# Patient Record
Sex: Male | Born: 1975 | Race: Black or African American | Hispanic: No | Marital: Single | Smoking: Never smoker
Health system: Southern US, Community
[De-identification: ages and names within clinical notes are randomized; demographics above are authoritative.]

## PROBLEM LIST (undated history)

## (undated) DIAGNOSIS — D61818 Other pancytopenia: Secondary | ICD-10-CM

## (undated) DIAGNOSIS — R161 Splenomegaly, not elsewhere classified: Secondary | ICD-10-CM

## (undated) DIAGNOSIS — K766 Portal hypertension: Secondary | ICD-10-CM

## (undated) DIAGNOSIS — D696 Thrombocytopenia, unspecified: Secondary | ICD-10-CM

## (undated) DIAGNOSIS — K7460 Unspecified cirrhosis of liver: Secondary | ICD-10-CM

## (undated) HISTORY — DX: Portal hypertension: K76.6

## (undated) HISTORY — DX: Unspecified cirrhosis of liver: K74.60

---

## 2015-04-26 ENCOUNTER — Encounter (HOSPITAL_COMMUNITY): Payer: Self-pay | Admitting: Emergency Medicine

## 2015-04-26 ENCOUNTER — Emergency Department (INDEPENDENT_AMBULATORY_CARE_PROVIDER_SITE_OTHER)
Admission: EM | Admit: 2015-04-26 | Discharge: 2015-04-26 | Disposition: A | Discharge status: Home / Self Care | Payer: Medicaid - Out of State | Source: Home / Self Care | Attending: Family Medicine | Admitting: Family Medicine

## 2015-04-26 ENCOUNTER — Emergency Department (HOSPITAL_COMMUNITY): Payer: Medicaid - Out of State

## 2015-04-26 ENCOUNTER — Observation Stay (HOSPITAL_COMMUNITY)
Admission: EM | Admit: 2015-04-26 | Discharge: 2015-05-02 | Disposition: A | Discharge status: Home / Self Care | Payer: Medicaid - Out of State | Attending: Internal Medicine | Admitting: Internal Medicine

## 2015-04-26 ENCOUNTER — Encounter: Payer: Self-pay | Admitting: *Deleted

## 2015-04-26 ENCOUNTER — Encounter (HOSPITAL_COMMUNITY): Payer: Self-pay | Admitting: *Deleted

## 2015-04-26 DIAGNOSIS — Z23 Encounter for immunization: Secondary | ICD-10-CM | POA: Diagnosis not present

## 2015-04-26 DIAGNOSIS — D61818 Other pancytopenia: Secondary | ICD-10-CM | POA: Diagnosis not present

## 2015-04-26 DIAGNOSIS — K766 Portal hypertension: Secondary | ICD-10-CM | POA: Diagnosis not present

## 2015-04-26 DIAGNOSIS — B182 Chronic viral hepatitis C: Secondary | ICD-10-CM

## 2015-04-26 DIAGNOSIS — R109 Unspecified abdominal pain: Secondary | ICD-10-CM

## 2015-04-26 DIAGNOSIS — R001 Bradycardia, unspecified: Secondary | ICD-10-CM | POA: Insufficient documentation

## 2015-04-26 DIAGNOSIS — D696 Thrombocytopenia, unspecified: Secondary | ICD-10-CM | POA: Diagnosis present

## 2015-04-26 DIAGNOSIS — R1902 Left upper quadrant abdominal swelling, mass and lump: Secondary | ICD-10-CM | POA: Diagnosis not present

## 2015-04-26 DIAGNOSIS — R160 Hepatomegaly, not elsewhere classified: Secondary | ICD-10-CM | POA: Diagnosis present

## 2015-04-26 DIAGNOSIS — M25562 Pain in left knee: Secondary | ICD-10-CM | POA: Diagnosis not present

## 2015-04-26 DIAGNOSIS — K922 Gastrointestinal hemorrhage, unspecified: Secondary | ICD-10-CM | POA: Diagnosis present

## 2015-04-26 DIAGNOSIS — K7460 Unspecified cirrhosis of liver: Secondary | ICD-10-CM | POA: Insufficient documentation

## 2015-04-26 DIAGNOSIS — R1012 Left upper quadrant pain: Secondary | ICD-10-CM | POA: Diagnosis not present

## 2015-04-26 DIAGNOSIS — R531 Weakness: Secondary | ICD-10-CM | POA: Insufficient documentation

## 2015-04-26 DIAGNOSIS — M25561 Pain in right knee: Secondary | ICD-10-CM | POA: Diagnosis not present

## 2015-04-26 DIAGNOSIS — D709 Neutropenia, unspecified: Secondary | ICD-10-CM | POA: Insufficient documentation

## 2015-04-26 DIAGNOSIS — M2550 Pain in unspecified joint: Secondary | ICD-10-CM

## 2015-04-26 DIAGNOSIS — R52 Pain, unspecified: Secondary | ICD-10-CM

## 2015-04-26 DIAGNOSIS — G8929 Other chronic pain: Secondary | ICD-10-CM

## 2015-04-26 DIAGNOSIS — Z139 Encounter for screening, unspecified: Secondary | ICD-10-CM

## 2015-04-26 DIAGNOSIS — I864 Gastric varices: Secondary | ICD-10-CM | POA: Diagnosis present

## 2015-04-26 DIAGNOSIS — R161 Splenomegaly, not elsewhere classified: Secondary | ICD-10-CM | POA: Diagnosis not present

## 2015-04-26 DIAGNOSIS — B1920 Unspecified viral hepatitis C without hepatic coma: Secondary | ICD-10-CM | POA: Diagnosis present

## 2015-04-26 HISTORY — DX: Thrombocytopenia, unspecified: D69.6

## 2015-04-26 HISTORY — DX: Other pancytopenia: D61.818

## 2015-04-26 HISTORY — DX: Splenomegaly, not elsewhere classified: R16.1

## 2015-04-26 LAB — COMPREHENSIVE METABOLIC PANEL
ALBUMIN: 3.8 g/dL (ref 3.5–5.0)
ALK PHOS: 46 U/L (ref 38–126)
ALT: 24 U/L (ref 17–63)
AST: 16 U/L (ref 15–41)
Anion gap: 10 (ref 5–15)
BUN: 8 mg/dL (ref 6–20)
CALCIUM: 8.9 mg/dL (ref 8.9–10.3)
CO2: 27 mmol/L (ref 22–32)
CREATININE: 1 mg/dL (ref 0.61–1.24)
Chloride: 103 mmol/L (ref 101–111)
GFR calc Af Amer: >60 mL/min (ref >60)
GFR calc non Af Amer: >60 mL/min (ref >60)
GLUCOSE: 95 mg/dL (ref 65–99)
Potassium: 4.1 mmol/L (ref 3.5–5.1)
SODIUM: 140 mmol/L (ref 135–145)
Total Bilirubin: 2.1 mg/dL — ABNORMAL HIGH (ref 0.3–1.2)
Total Protein: 6.9 g/dL (ref 6.5–8.1)

## 2015-04-26 LAB — CBC
HCT: 39.9 % (ref 39.0–52.0)
Hemoglobin: 13.3 g/dL (ref 13.0–17.0)
MCH: 28.4 pg (ref 26.0–34.0)
MCHC: 33.3 g/dL (ref 30.0–36.0)
MCV: 85.1 fL (ref 78.0–100.0)
PLATELETS: 21 10*3/uL — AB (ref 150–400)
RBC: 4.69 MIL/uL (ref 4.22–5.81)
RDW: 15.2 % (ref 11.5–15.5)
WBC: 1.2 10*3/uL — CL (ref 4.0–10.5)

## 2015-04-26 LAB — URINALYSIS, ROUTINE W REFLEX MICROSCOPIC
BILIRUBIN URINE: NEGATIVE
Glucose, UA: NEGATIVE mg/dL
HGB URINE DIPSTICK: NEGATIVE
Ketones, ur: NEGATIVE mg/dL
Leukocytes, UA: NEGATIVE
Nitrite: NEGATIVE
PROTEIN: NEGATIVE mg/dL
SPECIFIC GRAVITY, URINE: 1.026 (ref 1.005–1.030)
pH: 6 (ref 5.0–8.0)

## 2015-04-26 LAB — PROTIME-INR
INR: 1.52 — AB (ref 0.00–1.49)
Prothrombin Time: 18.3 seconds — ABNORMAL HIGH (ref 11.6–15.2)

## 2015-04-26 LAB — POCT URINALYSIS DIP (DEVICE)
BILIRUBIN URINE: NEGATIVE
Glucose, UA: NEGATIVE mg/dL
HGB URINE DIPSTICK: NEGATIVE
Ketones, ur: NEGATIVE mg/dL
LEUKOCYTES UA: NEGATIVE
Nitrite: NEGATIVE
Protein, ur: NEGATIVE mg/dL
Urobilinogen, UA: 1 mg/dL (ref 0.0–1.0)
pH: 6 (ref 5.0–8.0)

## 2015-04-26 LAB — LIPASE, BLOOD: Lipase: 24 U/L (ref 11–51)

## 2015-04-26 MED ORDER — SODIUM CHLORIDE 0.9 % IV BOLUS (SEPSIS)
500.0000 mL | Freq: Once | INTRAVENOUS | Status: AC
  Administered 2015-04-26: 500 mL via INTRAVENOUS

## 2015-04-26 MED ORDER — ALUM & MAG HYDROXIDE-SIMETH 200-200-20 MG/5ML PO SUSP: 30.0000 mL | Freq: Four times a day (QID) | ORAL | Status: DC | PRN

## 2015-04-26 MED ORDER — ONDANSETRON HCL 4 MG/2ML IJ SOLN
4.0000 mg | Freq: Three times a day (TID) | INTRAMUSCULAR | Status: DC | PRN
  Administered 2015-04-27: 4 mg via INTRAVENOUS
  Filled 2015-04-26: qty 2

## 2015-04-26 MED ORDER — SODIUM CHLORIDE 0.9% FLUSH
3.0000 mL | Freq: Two times a day (BID) | INTRAVENOUS | Status: DC
  Administered 2015-04-27 – 2015-05-02 (×11): 3 mL via INTRAVENOUS

## 2015-04-26 MED ORDER — SODIUM CHLORIDE 0.9 % IV BOLUS (SEPSIS)
1000.0000 mL | Freq: Once | INTRAVENOUS | Status: AC
  Administered 2015-04-27: 1000 mL via INTRAVENOUS

## 2015-04-26 MED ORDER — FENTANYL CITRATE (PF) 100 MCG/2ML IJ SOLN
50.0000 ug | Freq: Once | INTRAMUSCULAR | Status: AC
  Administered 2015-04-26: 50 ug via INTRAVENOUS
  Filled 2015-04-26: qty 2

## 2015-04-26 MED ORDER — MORPHINE SULFATE (PF) 2 MG/ML IV SOLN
2.0000 mg | INTRAVENOUS | Status: DC | PRN
  Administered 2015-04-27: 2 mg via INTRAVENOUS
  Filled 2015-04-26: qty 1

## 2015-04-26 MED ORDER — OXYCODONE HCL 5 MG PO TABS
5.0000 mg | ORAL_TABLET | Freq: Four times a day (QID) | ORAL | Status: DC | PRN
Start: 2015-04-26 — End: 2015-05-02
  Administered 2015-04-27 – 2015-05-02 (×8): 5 mg via ORAL
  Filled 2015-04-26 (×8): qty 1

## 2015-04-26 MED ORDER — IOHEXOL 300 MG/ML  SOLN
100.0000 mL | Freq: Once | INTRAMUSCULAR | Status: AC | PRN
  Administered 2015-04-26: 100 mL via INTRAVENOUS

## 2015-04-26 MED ORDER — PHYTONADIONE 5 MG PO TABS
5.0000 mg | ORAL_TABLET | Freq: Once | ORAL | Status: AC
  Administered 2015-04-27: 5 mg via ORAL
  Filled 2015-04-26: qty 1

## 2015-04-26 MED ORDER — DEXTROSE-NACL 5-0.45 % IV SOLN
INTRAVENOUS | Status: DC
  Administered 2015-04-27 (×2): via INTRAVENOUS

## 2015-04-26 NOTE — ED Provider Notes (Signed)
CSN: [id number]     Arrival date & time 04/26/15  1650 History   First MD Initiated Contact with Patient 04/26/15 1733     Chief Complaint  Patient presents with  . Abdominal Pain   (Consider location/radiation/quality/duration/timing/severity/associated sxs/prior Treatment) HPI Comments: 40 year old male from [place] speaks [place]ili and lingula presents with pain in the left upper quadrant for over a year. He had been living in [place] several months and has been seen physicians there. He apparently had a long history of untreated hepatitis C associated with abdominal pain. He also has a history of pancytopenia with severe thrombocytopenia with platelets 30,000. He was being followed by gastroenterology at that time but did not complete the evaluation. 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 The patient points to the left upper quadrant as a source of pain. He is not having vomiting. The pain is primarily constant but waxes and wanes. It is often worse with eating and sometimes when supine. He occasionally sees blood in the stools.    History reviewed. No pertinent past medical history. History reviewed. No pertinent past surgical history. No family history on file. Social History  Substance Use Topics  . Smoking status: Never Smoker   . Smokeless tobacco: None  . Alcohol Use: No    Review of Systems  Constitutional: Positive for activity change and appetite change. Negative for fever.  HENT: Negative.   Respiratory: Negative for shortness of breath.   Cardiovascular: Positive for chest pain.  Gastrointestinal: Positive for abdominal pain and blood in stool.  Skin: Negative.   Hematological: Bruises/bleeds easily.    Allergies  Review of patient's allergies indicates no known allergies.  Home Medications   Prior to Admission medications   Not on File   Meds Ordered and Administered this Visit  Medications - No data to display  BP  122/81 mmHg  Pulse 88  Temp(Src) 97.1 F (36.2 C) (Oral)  SpO2 98% No data found.   Physical Exam  Constitutional: He appears well-developed. No distress.  Eyes: EOM are normal.  Neck: Normal range of motion. Neck supple.  Cardiovascular: Normal rate, regular rhythm and normal heart sounds.   Pulmonary/Chest: Effort normal. No respiratory distress. He has no wheezes.  Abdominal: Bowel sounds are normal. He exhibits mass. There is tenderness.  Abdomen flat. There is a mass to the left upper quadrant. Percussion to the epigastrium his tympanic. Percussion to the left upper quadrant is flat. There is dullness to the right lower quadrant. Tenderness to the epigastrium and left upper quadrant as well as across the lower abdomen.  Neurological: He is alert. He exhibits normal muscle tone.  Skin: Skin is warm and dry. He is not diaphoretic.  Nursing note and vitals reviewed.   ED Course  Procedures (including critical care time)  Labs Review Labs Reviewed  POCT URINALYSIS DIP (DEVICE)    Imaging Review No results found.   Visual Acuity Review  Right Eye Distance:   Left Eye Distance:   Bilateral Distance:    Right Eye Near:   Left Eye Near:    Bilateral Near:         MDM   1. Chronic abdominal pain   2. Chronic hepatitis C without hepatic coma (HCC)   3. Abdominal mass, left upper quadrant   4. Thrombocytopenia (HCC)    Patient has been in the area for only a few days. He has no PCP for follow-up. He is complaining of increasing  abdominal pain, progressing over the past year.    He has in his possession documentation of having hepatitis C, chronic abdominal pain and thrombocytopenia. There is a left upper quadrant mass and tenderness. He is being transferred to the emergency department for evaluation.   Hayden Rasmussenavid Nallely Yost, NP 04/26/15 [phone]

## 2015-04-26 NOTE — Congregational Nurse Program (Signed)
Congregational Nurse Program Note  Date of Encounter: 04/26/2015  Past Medical History: No past medical history on file.  Encounter Details:     CNP Questionnaire - 04/26/15 1600    Patient Demographics   Is this a new or existing patient? New   Patient is considered a/an Refugee   Race African   Patient Assistance   Location of Patient Assistance [place]   Patient's financial/insurance status Low Income   Uninsured Patient Yes   Interventions Referred to ED/Urgent Care   Patient referred to apply for the following financial assistance Not Applicable   Food insecurities addressed Not Applicable   Transportation assistance Yes   Type of Assistance Other   Assistance securing medications No   Educational health offerings Navigating the healthcare system   Encounter Details   Primary purpose of visit Navigating the Healthcare System   Was an Emergency Department visit averted? No   Does patient have a medical provider? No   Patient referred to Emergency Department   Was a mental health screening completed? (GAINS tool) No   Does patient have dental issues? No   Does patient have vision issues? No   Since previous encounter, have you referred patient for abnormal blood pressure that resulted in a new diagnosis or medication change? No   Since previous encounter, have you referred patient for abnormal blood glucose that resulted in a new diagnosis or medication change? No       Client came to center c/o abdominal pain and that he did not have insurance here and [place] with his uncle. Gwenyth BenderWr [employer] student Social /case manager transported client to ED will follow up with her and client next week.

## 2015-04-26 NOTE — ED Notes (Signed)
When pt is ready to be picked up please call [facility] at [phone]

## 2015-04-26 NOTE — H&P (Signed)
[facility] History and Physical  Howard Bunte EXN:[id number] DOB: 1975/02/25 DOA: 04/26/2015  Referring physician: ED physician PCP: No primary care provider on file.  Specialists:   Chief Complaint: abdominal pain, knee pain and rectal bleeding  HPI: Antonio Armstrong is a 40 y.o. male with PMH of HCV, pancytopenia, abdominal pain, joint pain over both knee joint and ankles, epistaxis, who presents with abdominal pain, knee pain and rectal bleeding.  Pt is a refugee from [place]. He dose not speak [place]. 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 Medical history is obtained through translator on the phone. Pt has been recently staying in [place], [place] to receive some medical treatment and vaccination in [place]. In [place], he was followed by [facility] gastroenterologist last year. The note from 12/2014 indicated that specialist was waiting for viral load, fibrosis testing and genotype. He had abnormal heterogeneity on hepatic ultrasound with possible cirrhosis per clinic note. Patient had been tested for TB which was negative, negative for hep B. He has had chickenpox vaccination, T data, influenza and HBV, MMR vaccination.  Today patient states that he hasabdominal pain for about one year. His abdominal pain is located in the left upper quadrant, constant, moderate. No nausea, vomiting, diarrhea. He also has bilaterally knee pain which has been going on for a long time. Patient has generalized weakness. He reports having nosebleeding and rectal bleeding sometimes. Patient does not have cough, chest pain, shortness breath, symptoms of UTI or unilateral weakness.  In ED, patient was found to have an elevated total bilirubin 2.1 on  04/07/15-->2.1, WBC 4.1 on 04/07/15--> 1.2, platelet 37-->21, negative urinalysis, lipase 24, INR 1.52, lactate 1.52, temperature normal, bradycardia, electrolytes and renal function okay. 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 gallbladder wall thickening may be due to portal hypertension versus cholecystitis. Patient admitted to inpatient for further evaluation and treatment.  EKG:   Not done in ED, will get one.   Where does patient live?   At home Can patient participate in ADLs?   Some   Review of Systems:   General: no fevers, chills, no changes in body weight, has poor appetite, has fatigue HEENT: no blurry vision, hearing changes or sore throat Pulm: no dyspnea, coughing, wheezing CV: no chest pain, no palpitations Abd: no nausea, vomiting, has abdominal pain, no diarrhea, constipation GU: no dysuria, burning on urination, increased urinary frequency, hematuria  Ext: no leg edema. Has bilateral knee pain. Neuro: no unilateral weakness, numbness, or tingling, no vision change or hearing loss Skin: no rash MSK: No muscle spasm, no deformity, no limitation of range of movement in spin Heme: No easy bruising.  Travel history: No recent long distant travel.  Allergy: No Known Allergies  Past Medical History  Diagnosis Date  . HCV (hepatitis C virus)   . Thrombocytopenia ([place])   . Pancytopenia ([place])   . Splenomegaly     No past surgical history on file.  Social History:  reports that he has never smoked. He does not have any smokeless tobacco history on file. He reports that he does not drink alcohol or use illicit drugs.  Family History:  Family History  Problem Relation Age of Onset  . Headache Mother   . Bleeding Disorder Brother     Nosebleeding     Prior to Admission medications   Not on File    Physical Exam: Filed Vitals:   04/26/15 2045 04/26/15  2100 04/26/15 2115 04/26/15 2130  BP: 134/93 113/83 126/94 118/83  Pulse: 61 51 67 58  Temp:      TempSrc:      Resp: _0 SpO2: 99% 99% 100% 96%   General: Not in acute distress HEENT:       Eyes: PERRL, EOMI, no scleral icterus.       ENT:  No discharge from the ears and nose, no pharynx injection, no tonsillar enlargement.        Neck: No JVD, no bruit, no mass felt. Heme: No neck lymph node enlargement. Cardiac: S1/S2, RRR, No murmurs, No gallops or rubs. Pulm: No rales, wheezing, rhonchi or rubs. Abd: Soft, distended, tenderness over LUQ, no rebound pain, has splenomegaly, BS present. Ext: No pitting leg edema bilaterally. 2+DP/PT pulse bilaterally. Has tenderness over both knee, but no obvious joint effusion or swelling. Musculoskeletal: No joint deformities, No joint redness or warmth, no limitation of ROM in spin. Skin: No rashes.  Neuro: Alert, oriented X3, cranial nerves II-XII grossly intact, moves all extremities normally. Psych: Patient is not psychotic, no suicidal or hemocidal ideation.  Labs on Admission:  Basic Metabolic Panel:  Recent Labs Lab 04/26/15 1932  NA 140  K 4.1  CL 103  CO2 27  GLUCOSE 95  BUN 8  CREATININE 1.00  CALCIUM 8.9   Liver Function Tests:  Recent Labs Lab 04/26/15 1932  AST 16  ALT 24  ALKPHOS 46  BILITOT 2.1*  PROT 6.9  ALBUMIN 3.8    Recent Labs Lab 04/26/15 1932  LIPASE 24   No results for input(s): AMMONIA in the last 168 hours. CBC:  Recent Labs Lab 04/26/15 1932  WBC 1.2*  HGB 13.3  HCT 39.9  MCV 85.1  PLT 21*   Cardiac Enzymes: No results for input(s): CKTOTAL, CKMB, CKMBINDEX, TROPONINI in the last 168 hours.  BNP (last 3 results) No results for input(s): BNP in the last 8760 hours.  ProBNP (last 3 results) No results for input(s): PROBNP in the last 8760 hours.  CBG: No results for input(s): GLUCAP in the last 168 hours.  Radiological Exams on Admission: Ct Abdomen Pelvis W Contrast  04/26/2015  CLINICAL DATA:  Abdominal pain and pancytopenia.  Hepatitis-C EXAM: CT ABDOMEN AND PELVIS WITH CONTRAST TECHNIQUE: Multidetector CT imaging of the abdomen and pelvis was performed using the standard protocol following bolus administration of  intravenous contrast. CONTRAST:  153m OMNIPAQUE IOHEXOL 300 MG/ML  SOLN COMPARISON:  None. FINDINGS: The machine malfunctioned midway through the scan. The abdomen pelvis were scanned separately approximately 1 minute apart from each other. Lung bases clear.  Cardiac enlargement. Changes of cirrhosis with hepatomegaly. Irregular liver capsule compatible with scarring and cirrhosis. Marked splenomegaly. Enlarged splenic vein. Enlarged portal vein. Gastric varices. Findings consistent with portal hypertension. Gallbladder wall thickening could be due to portal hypertension versus cholecystitis. No calcified gallstone. No biliary duct dilatation. Kidneys show no renal mass or obstruction. Normal pancreas without evidence of pancreatitis or calcification or mass. Negative for bowel obstruction.  No bowel mass or edema. Small amount of ascites in the right lower quadrant. No pelvic ascites. Urinary bladder normal.  No adenopathy. No acute skeletal abnormality. IMPRESSION: Cirrhosis with splenomegaly and portal hypertension. 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 Mild amount of ascites in the right lower quadrant. Gallbladder wall thickening may be due to portal hypertension versus cholecystitis. Correlate with pain in this area. Electronically Signed   By: CFranchot GalloM.D.   On:  04/26/2015 22:52    Assessment/Plan Principal Problem:   Abdominal pain Active Problems:   HCV (hepatitis C virus)   Thrombocytopenia (HCC)   Pancytopenia (HCC)   Splenomegaly   GIB (gastrointestinal bleeding)   Knee pain   Abdominal pain: his abdominal pain seems to be related to his splenomegaly. He has tenderness over left upper quadrant where there is splenomegaly. CT-abd/pelvis showed cirrhosis with mild amount of ascites in the right lower quadrant, but pt does not have pain over RLQ and no fever, less likely to have SBP. Lipase normal.   -will admit to tele bed (due to bradycardia) -prn control: prn oxycodone and morphine  (patient is not a good candidate for Tylenol due to liver disease and NSAIDS due to GIB) -prn zofran for nausea -IVF: 1.5 NS and then D5-1/2 NS at 75 cc/h -request medical record -case manager consult  Bilateral knee pain: Unclear etiology. Potential differential diagnosis include cryoglobulinemia-induced arthralgia secondary to hepatitis C and hemaarthritis 2/2 to thrombocytopenia, which is less likely since patient does not have joint effusion on my examination. -prn oxycodone -check cryoglobine   HCV and cirrhosis: INR 1.52. Pt has nose bleeding and rectal bleeding some times. -Vitamin K 5 mg  -Avoid liver toxic meds, such as tylenol -check  -hepatitis panel and HIV antibody -may give referral to GI as outpt  Thrombocytopenia ([place]): likely due to splenomegaly 2/2 to HCV and cirhrosis. He has nose bleeding and rectal bleeding some time, no bleeding currently -f/u by CBC  Hx of pancytopenia ([place]): now hgb ok at 13.3. WBC 1.2 and platelets 21 -May give referral to Hematology.  -anemia panel  GIB (gastrointestinal bleeding): likely due to thrombocytopenia and slightly elevated INR 1.52. Hgb stable -may give referral to GI as outpt -FOBT -INR/PTT/type & screen   DVT ppx: SCD Code Status: Full code Family Communication: None at bed side.  Disposition Plan: Admit to inpatient   Date of Service 04/27/2015    Ivor Costa [facility] Pager [phone]  If 7PM-7AM, please contact night-coverage [email or web address] Password [email or web address] 04/27/2015, 1:14 AM

## 2015-04-26 NOTE — ED Notes (Signed)
Pt speaks Swahili, hospital provided interpreter used for translation. Pt states that he has been experiencing left abd pain and swelling. States it has been going on over 1 year.

## 2015-04-26 NOTE — ED Notes (Signed)
Pt here with c/o 1 year left lower abdominal pain that worsens with eating certain foods and lying down Small bloody stools noted as well Denies vomiting, diarrhea  [place] interpretor used for communication

## 2015-04-26 NOTE — ED Provider Notes (Signed)
CSN: [id number]     Arrival date & time 04/26/15  1859 History   First MD Initiated Contact with Patient 04/26/15 2025     Chief Complaint  Patient presents with  . Abdominal Pain     (Consider location/radiation/quality/duration/timing/severity/associated sxs/prior Treatment) HPI  Interview done with phone interpretor as he speaks only Swahili Dense to the emergency department as transferred from the urgent care for further evaluation. He is a refugee from [place] and has been recently staying in [place] to receive medical treatment with long term history of Hep C with chronic abdominal pain. Per medical records He has a history of pancytopenia with severe thrombocytopenia. In [place] he was followed by gastroenterologist last year. The patient describes being lost in follow-up. Patient had been tested for TB which was negative, negative for hep B, he has had chickenpox vaccination, T data, influenza vaccination. He is negative for syphilis, negative for chlamydia, negative for HIV.  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 He reports feeling very weak, having such severe pain that he has hard time going up the stairs, frequent nosebleeds, intermittent GI bleeding. Besides the notes that he brought from 04/06/2005 from [place] no other past medical history or previous medical treatment is available. The patient states that he does not know any more information on what has been done for him. Denies previous hx of surgery  Past Medical History  Diagnosis Date  . HCV (hepatitis C virus)   . Thrombocytopenia (HCC)    No past surgical history on file. No family history on file. Social History  Substance Use Topics  . Smoking status: Never Smoker   . Smokeless tobacco: None  . Alcohol Use: No    Review of Systems  Level V caveat- language barrier has made it challenging obtaining history of present illness and  ROS.  Allergies  Review of patient's allergies indicates no known allergies.  Home Medications   Prior to Admission medications   Not on File   BP 118/83 mmHg  Pulse 58  Temp(Src) 97.8 F (36.6 C) (Oral)  Resp 13  SpO2 96% Physical Exam  Constitutional: He appears well-developed and well-nourished. No distress.  HENT:  Head: Normocephalic and atraumatic.  Right Ear: Tympanic membrane and ear canal normal.  Left Ear: Tympanic membrane and ear canal normal.  Nose: Nose normal.  Mouth/Throat: Uvula is midline, oropharynx is clear and moist and mucous membranes are normal.  Eyes: Conjunctivae, EOM and lids are normal. Pupils are equal, round, and reactive to light.  Neck: Normal range of motion. Neck supple.  Cardiovascular: Normal rate and regular rhythm.   Pulmonary/Chest: Effort normal.  Abdominal: Soft. There is tenderness in the epigastric area, periumbilical area and left upper quadrant. There is no rigidity, no rebound and no guarding.  No signs of abdominal distention  Musculoskeletal:  No LE swelling, or abnormality on exam aside from tenderness. No evidence of septic joint.  Neurological: He is alert.  Acting at baseline  Skin: Skin is warm and dry. No rash noted.  Nursing note and vitals reviewed.   ED Course  Procedures (including critical care time) Labs Review Labs Reviewed  COMPREHENSIVE METABOLIC PANEL - Abnormal; Notable for the following:    Total Bilirubin 2.1 (*)    All other components within normal limits  CBC - Abnormal; Notable for the following:    WBC 1.2 (*)    Platelets 21 (*)  All other components within normal limits  URINALYSIS, ROUTINE W REFLEX MICROSCOPIC (NOT AT [facility]) - Abnormal; Notable for the following:    Color, Urine AMBER (*)    All other components within normal limits  PROTIME-INR - Abnormal; Notable for the following:    Prothrombin Time 18.3 (*)    INR 1.52 (*)    All other components within normal limits  LIPASE, BLOOD     Imaging Review No results found. I have personally reviewed and evaluated these images and lab results as part of my medical decision-making.   EKG Interpretation None      MDM   Final diagnoses:  Pancytopenia (HCC)  Left upper quadrant pain  Arthralgia   PatientWith worsened pancytopenia, white blood cell count 1.2 and platelets 21. PT/INR is elevated at 1.52.  Patient has CT scan of abdomen and pelvis pending  Discussed case with [facility] for unassigned admission for further evaluation. Patient is hemodynamically stable at this time. Inpatient, [facility] admits, Tele,  [facility].  Filed Vitals:   04/26/15 2115 04/26/15 2130  BP: 126/94 118/83  Pulse: 67 58  Temp:    Resp: 20 [address], PA-C 04/26/15 2219  Courteney Randall An, MD 04/27/15 0030

## 2015-04-27 DIAGNOSIS — M2550 Pain in unspecified joint: Secondary | ICD-10-CM | POA: Diagnosis not present

## 2015-04-27 DIAGNOSIS — B182 Chronic viral hepatitis C: Secondary | ICD-10-CM | POA: Diagnosis present

## 2015-04-27 DIAGNOSIS — D61818 Other pancytopenia: Secondary | ICD-10-CM

## 2015-04-27 DIAGNOSIS — R160 Hepatomegaly, not elsewhere classified: Secondary | ICD-10-CM | POA: Diagnosis not present

## 2015-04-27 DIAGNOSIS — M25562 Pain in left knee: Secondary | ICD-10-CM

## 2015-04-27 DIAGNOSIS — R1012 Left upper quadrant pain: Secondary | ICD-10-CM | POA: Diagnosis not present

## 2015-04-27 DIAGNOSIS — M25561 Pain in right knee: Secondary | ICD-10-CM | POA: Diagnosis not present

## 2015-04-27 DIAGNOSIS — R161 Splenomegaly, not elsewhere classified: Secondary | ICD-10-CM | POA: Diagnosis not present

## 2015-04-27 DIAGNOSIS — I864 Gastric varices: Secondary | ICD-10-CM | POA: Diagnosis not present

## 2015-04-27 LAB — TYPE AND SCREEN
ABO/RH(D): A POS
ABO/RH(D): A POS
Antibody Screen: POSITIVE
Antibody Screen: POSITIVE
DAT, IgG: NEGATIVE

## 2015-04-27 LAB — CBG MONITORING, ED: Glucose-Capillary: 104 mg/dL — ABNORMAL HIGH (ref 65–99)

## 2015-04-27 LAB — CBC
HEMATOCRIT: 37.3 % — AB (ref 39.0–52.0)
HEMOGLOBIN: 12.3 g/dL — AB (ref 13.0–17.0)
MCH: 28.1 pg (ref 26.0–34.0)
MCHC: 33 g/dL (ref 30.0–36.0)
MCV: 85.2 fL (ref 78.0–100.0)
Platelets: 22 10*3/uL — CL (ref 150–400)
RBC: 4.38 MIL/uL (ref 4.22–5.81)
RDW: 15 % (ref 11.5–15.5)
WBC: 1 10*3/uL — AB (ref 4.0–10.5)

## 2015-04-27 LAB — DIFFERENTIAL
BASOS ABS: 0 10*3/uL (ref 0.0–0.1)
Basophils Relative: 1 %
EOS ABS: 0 10*3/uL (ref 0.0–0.7)
EOS PCT: 3 %
LYMPHS ABS: 0.4 10*3/uL — AB (ref 0.7–4.0)
LYMPHS PCT: 34 %
MONO ABS: 0.1 10*3/uL (ref 0.1–1.0)
Monocytes Relative: 9 %
NEUTROS PCT: 53 %
Neutro Abs: 0.6 10*3/uL — ABNORMAL LOW (ref 1.7–7.7)

## 2015-04-27 LAB — VITAMIN B12: VITAMIN B 12: 572 pg/mL (ref 180–914)

## 2015-04-27 LAB — COMPREHENSIVE METABOLIC PANEL
ALBUMIN: 3.4 g/dL — AB (ref 3.5–5.0)
ALT: 20 U/L (ref 17–63)
AST: 16 U/L (ref 15–41)
Alkaline Phosphatase: 37 U/L — ABNORMAL LOW (ref 38–126)
Anion gap: 10 (ref 5–15)
BILIRUBIN TOTAL: 2 mg/dL — AB (ref 0.3–1.2)
BUN: 8 mg/dL (ref 6–20)
CHLORIDE: 107 mmol/L (ref 101–111)
CO2: 23 mmol/L (ref 22–32)
Calcium: 8.3 mg/dL — ABNORMAL LOW (ref 8.9–10.3)
Creatinine, Ser: 0.88 mg/dL (ref 0.61–1.24)
GFR calc Af Amer: >60 mL/min (ref >60)
GFR calc non Af Amer: >60 mL/min (ref >60)
GLUCOSE: 80 mg/dL (ref 65–99)
POTASSIUM: 3.6 mmol/L (ref 3.5–5.1)
Sodium: 140 mmol/L (ref 135–145)
TOTAL PROTEIN: 6.4 g/dL — AB (ref 6.5–8.1)

## 2015-04-27 LAB — RETICULOCYTES
RBC.: 4.41 MIL/uL (ref 4.22–5.81)
Retic Count, Absolute: 48.5 10*3/uL (ref 19.0–186.0)
Retic Ct Pct: 1.1 % (ref 0.4–3.1)

## 2015-04-27 LAB — IRON AND TIBC
IRON: 63 ug/dL (ref 45–182)
SATURATION RATIOS: 22 % (ref 17.9–39.5)
TIBC: 288 ug/dL (ref 250–450)
UIBC: 225 ug/dL

## 2015-04-27 LAB — APTT: aPTT: 34 seconds (ref 24–37)

## 2015-04-27 LAB — FOLATE: FOLATE: 10.8 ng/mL (ref >5.9)

## 2015-04-27 LAB — FERRITIN: Ferritin: 10 ng/mL — ABNORMAL LOW (ref 24–336)

## 2015-04-27 MED ORDER — PROPRANOLOL HCL 10 MG PO TABS
10.0000 mg | ORAL_TABLET | Freq: Three times a day (TID) | ORAL | Status: DC
Start: 2015-04-27 — End: 2015-05-02
  Administered 2015-04-27 – 2015-05-02 (×16): 10 mg via ORAL
  Filled 2015-04-27 (×22): qty 1

## 2015-04-27 MED ORDER — INFLUENZA VAC SPLIT QUAD 0.5 ML IM SUSY
0.5000 mL | PREFILLED_SYRINGE | INTRAMUSCULAR | Status: AC
  Administered 2015-04-29: 0.5 mL via INTRAMUSCULAR
  Filled 2015-04-27: qty 0.5

## 2015-04-27 MED ORDER — PNEUMOCOCCAL VAC POLYVALENT 25 MCG/0.5ML IJ INJ
0.5000 mL | INJECTION | INTRAMUSCULAR | Status: AC
  Administered 2015-04-29: 0.5 mL via INTRAMUSCULAR
  Filled 2015-04-27: qty 1
  Filled 2015-04-27: qty 0.5

## 2015-04-27 MED ORDER — PANTOPRAZOLE SODIUM 40 MG PO TBEC
40.0000 mg | DELAYED_RELEASE_TABLET | Freq: Every day | ORAL | Status: DC
  Administered 2015-04-28 – 2015-05-02 (×5): 40 mg via ORAL
  Filled 2015-04-27 (×5): qty 1

## 2015-04-27 NOTE — ED Notes (Signed)
Pt oob to ambulate to BR with very steady gait.

## 2015-04-27 NOTE — ED Notes (Signed)
[facility] comes from [facility] center. Representative and friend from the center called twice to speak with the patient and requests that he get a Child psychotherapistsocial worker consult for financial reasons. Friend: Antonio Armstrong can be reached at [phone] if there are any questions.

## 2015-04-27 NOTE — Plan of Care (Signed)
Problem: Education: Goal: Knowledge of [place] [facility] Education information/materials will improve  Admission information reviewed via an interpreter, with exception of smoking - patient stated he is a non-smoker

## 2015-04-27 NOTE — Progress Notes (Signed)
[facility] PROGRESS NOTE  Antonio Armstrong WUJ:[id number] DOB: 08/05/1975 DOA: 04/26/2015 PCP: No primary care provider on file.  Assessment/Plan: #1 abdominal pain Likely secondary to splenomegaly noted on CT abdomen and pelvis. CT with cirrhosis with splenomegaly and portal hypertension with gastric varices being present. Patient will likely need to follow-up with GI in the outpatient setting. Continue pain management with when necessary oxycodone and morphine. Monitor closely.  #2 neutropenia/thrombocytopenia Chronic in nature. Likely secondary to ongoing cirrhosis and splenomegaly. Patient with no overt bleeding. Patient currently afebrile. Follow.  #3 bilateral knee pain Unclear etiology. Patient with no effusions noted on examination. Pain management.  #4 hepatitis C and cirrhosis INR 1.52. Patient with no current nasal bleeding or rectal bleeding. HIV pending. Acute hepatitis panel pending. Will need outpatient follow-up with GI. Will place on low-dose propranolol. Continue PPI.  #5 intermittent rectal bleeding Likely secondary to thrombocytopenia. Currently no bleeding. Follow H&H. We'll need to follow-up with GI as outpatient.  #6 prophylaxis PPI for GI prophylaxis. SCDs for DVT prophylaxis.  Code Status: Full Family Communication: Full Disposition Plan: Home once pain is controlled and counts remained stable with outpatient follow-up with PCP and GI.   Consultants:  None  Procedures:  CT abdomen and pelvis 04/26/2015    Antibiotics:  None  HPI/Subjective: Patient states abdominal pain improved with pain medications. No chest pain. No shortness of breath.  Objective: Filed Vitals:   04/27/15 1230 04/27/15 1330  BP: 107/76 115/69  Pulse: 51 53  Temp:  98 F (36.7 C)  Resp: 18 18    Intake/Output Summary (Last 24 hours) at 04/27/15 1540 Last data filed at 04/27/15 1400  Gross per 24 hour  Intake   1020 ml  Output    350 ml  Net    670 ml    There were no vitals filed for this visit.  Exam:   General:  NAD  Cardiovascular: RRR  Respiratory: CTAB  Abdomen: Soft/NT/ND/+BS/SPLENOMEGALY  Musculoskeletal: No c/c/e   Data Reviewed: Basic Metabolic Panel:  Recent Labs Lab 04/26/15 1932 04/27/15 0031  NA 140 140  K 4.1 3.6  CL 103 107  CO2 27 23  GLUCOSE 95 80  BUN 8 8  CREATININE 1.00 0.88  CALCIUM 8.9 8.3*   Liver Function Tests:  Recent Labs Lab 04/26/15 1932 04/27/15 0031  AST 16 16  ALT 24 20  ALKPHOS 46 37*  BILITOT 2.1* 2.0*  PROT 6.9 6.4*  ALBUMIN 3.8 3.4*    Recent Labs Lab 04/26/15 1932  LIPASE 24   No results for input(s): AMMONIA in the last 168 hours. CBC:  Recent Labs Lab 04/26/15 1932 04/27/15 0031 04/27/15 1351  WBC 1.2* 1.0*  --   NEUTROABS  --   --  PENDING  HGB 13.3 12.3*  --   HCT 39.9 37.3*  --   MCV 85.1 85.2  --   PLT 21* 22*  --    Cardiac Enzymes: No results for input(s): CKTOTAL, CKMB, CKMBINDEX, TROPONINI in the last 168 hours. BNP (last 3 results) No results for input(s): BNP in the last 8760 hours.  ProBNP (last 3 results) No results for input(s): PROBNP in the last 8760 hours.  CBG:  Recent Labs Lab 04/27/15 1229  GLUCAP 104*    No results found for this or any previous visit (from the past 240 hour(s)).   Studies: Ct Abdomen Pelvis W Contrast  04/26/2015  CLINICAL DATA:  Abdominal pain and pancytopenia.  Hepatitis-C EXAM: CT ABDOMEN AND  PELVIS WITH CONTRAST TECHNIQUE: Multidetector CT imaging of the abdomen and pelvis was performed using the standard protocol following bolus administration of intravenous contrast. CONTRAST:  OMNIPAQUE IOHEXOL 300 MG/ML  SOLN COMPARISON:  None. FINDINGS: The machine malfunctioned midway through the scan. The abdomen pelvis were scanned separately approximately 1 minute apart from each other. Lung bases clear.  Cardiac enlargement. Changes of cirrhosis with hepatomegaly. Irregular liver capsule compatible  with scarring and cirrhosis. Marked splenomegaly. Enlarged splenic vein. Enlarged portal vein. Gastric varices. Findings consistent with portal hypertension. Gallbladder wall thickening could be due to portal hypertension versus cholecystitis. No calcified gallstone. No biliary duct dilatation. Kidneys show no renal mass or obstruction. Normal pancreas without evidence of pancreatitis or calcification or mass. Negative for bowel obstruction.  No bowel mass or edema. Small amount of ascites in the right lower quadrant. No pelvic ascites. Urinary bladder normal.  No adenopathy. No acute skeletal abnormality. IMPRESSION: Cirrhosis with splenomegaly and portal hypertension. 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 Mild amount of ascites in the right lower quadrant. Gallbladder wall thickening may be due to portal hypertension versus cholecystitis. Correlate with pain in this area. Electronically Signed   By: Marlan Palau M.D.   On: 04/26/2015 22:52    Scheduled Meds: . [START ON 04/28/2015] Influenza vac split quadrivalent PF  0.5 mL Intramuscular Tomorrow-1000  . [START ON 04/28/2015] pneumococcal 23 valent vaccine  0.5 mL Intramuscular Tomorrow-1000  . sodium chloride flush  3 mL Intravenous Q12H   Continuous Infusions: . dextrose 5 % and 0.45% NaCl 75 mL/hr at 04/27/15 1327    Principal Problem:   Abdominal pain Active Problems:   HCV (hepatitis C virus)   Thrombocytopenia (HCC)   Pancytopenia (HCC)   Splenomegaly   GIB (gastrointestinal bleeding)   Knee pain    Time spent: 35 mins    [facility] MD [facility] Pager [phone]. If 7PM-7AM, please contact night-coverage at [email or web address], password [facility] 04/27/2015, 3:40 PM  LOS: 1 day

## 2015-04-27 NOTE — Progress Notes (Signed)
Received Mr. Delfina RedwoodMasombuko to room 3E11 from ED.  D51/2 NS infusing at 75cc/hr.  Patient is non-english speaking.  Will contact interpreter to complete admission and will contact Dr. Janee Mornhompson for admission orders.

## 2015-04-27 NOTE — ED Notes (Signed)
MD at bedside. Dr. Thompson 

## 2015-04-27 NOTE — Progress Notes (Signed)
Completed initial assessment via Swahili interpreter via  interpreter service.  Stated his left lower quadrant pain is currently a level 3 out of 10 as worst pain.  Stated he has knee pain when he goes up and down stairs.  See assessment for physical data.  He was asked to save all urine for us to measure and to point to his abdomen when he is in pain so we can provide pain medication.  All confirmed via interpreter.

## 2015-04-27 NOTE — Evaluation (Signed)
Physical Therapy Evaluation and D/C Patient Details Name: Antonio Armstrong Addo MRN: [id number] DOB: 06/25/1975 Today's Date: 04/27/2015   History of Present Illness  Antonio Armstrong is a 40 y.o. male with PMH of HCV, pancytopenia, abdominal pain, joint pain over both knee joint and ankles, epistaxis, who presents with abdominal pain, knee pain and rectal bleeding.  Clinical Impression  Pt admitted with above diagnosis. Pt currently without significant functional limitations and is ambulating well without device. May need assist on steps at home prn and uncle and case worker present aware.  Will not follow pt as pt will not benefit from further skilled PT at this time.  Sign off.       Follow Up Recommendations No PT follow up    Equipment Recommendations  None recommended by PT    Recommendations for Other Services       Precautions / Restrictions Precautions Precautions: None Restrictions Weight Bearing Restrictions: No      Mobility  Bed Mobility Overal bed mobility: Independent                Transfers Overall transfer level: Independent                  Ambulation/Gait Ambulation/Gait assistance: Independent Ambulation Distance (Feet): 400 Feet Assistive device: None Gait Pattern/deviations: Step-through pattern;Decreased stride length   Gait velocity interpretation: Below normal speed for age/gender General Gait Details: Pt was able to ambulate without device with steady gait with ability to accept challenges.   Stairs Stairs: Yes Stairs assistance: Supervision Stair Management: One rail Right;Step to pattern;Forwards Number of Stairs: 5 General stair comments: Pt did not need assist but was slower going up and down steps however was able to do it.    Wheelchair Mobility    Modified Rankin (Stroke Patients Only)       Balance Overall balance assessment: Needs assistance Sitting-balance support: No upper extremity supported;Feet supported Sitting  balance-Leahy Scale: Fair     Standing balance support: No upper extremity supported;During functional activity Standing balance-Leahy Scale: Fair Standing balance comment: Pt able to take challenges to balance without device and did not need UE support.              High level balance activites: Backward walking;Sudden stops;Turns High Level Balance Comments: Pt can ambulate with above without assist.              Pertinent Vitals/Pain Pain Assessment: No/denies pain  VSS    Home Living Family/patient expects to be discharged to:: Private residence (Refugee- small apartment with uncle,2 kids and roommat) Living Arrangements: Non-relatives/Friends;Other relatives Available Help at Discharge: Family;Available PRN/intermittently Type of Home: Apartment Home Access: Level entry     Home Layout: Two level;Bed/bath upstairs Home Equipment: None      Prior Function Level of Independence: Independent               Hand Dominance        Extremity/Trunk Assessment   Upper Extremity Assessment: Defer to OT evaluation           Lower Extremity Assessment: Generalized weakness      Cervical / Trunk Assessment: Normal  Communication   Communication: Interpreter utilized;Prefers language other than English Antonio Armstrong(Swahily - nurse had interpreter on line when PT arrived)  Cognition Arousal/Alertness: Awake/alert Behavior During Therapy: Flat affect Overall Cognitive Status: Difficult to assess                      General  Comments      Exercises        Assessment/Plan    PT Assessment Patent does not need any further PT services  PT Diagnosis     PT Problem List    PT Treatment Interventions     PT Goals (Current goals can be found in the Care Plan section) Acute Rehab PT Goals PT Goal Formulation: All assessment and education complete, DC therapy    Frequency     Barriers to discharge        Co-evaluation               End of  Session Equipment Utilized During Treatment: Gait belt Activity Tolerance: Patient limited by fatigue Patient left: in bed;with call bell/phone within reach;with family/visitor present (uncle and a case worker was present ) Nurse Communication: Mobility status    Functional Assessment Tool Used: clinical judgment Functional Limitation: Mobility: Walking and moving around Mobility: Walking and Moving Around Current Status [phone]): 0 percent impaired, limited or restricted Mobility: Walking and Moving Around Goal Status [phone]): 0 percent impaired, limited or restricted Mobility: Walking and Moving Around Discharge Status [phone]): 0 percent impaired, limited or restricted    Time: 1140-1200 PT Time Calculation (min) (ACUTE ONLY): 20 min   Charges:   PT Evaluation $PT Eval Moderate Complexity: 1 Procedure     PT G Codes:   PT G-Codes **NOT FOR INPATIENT CLASS** Functional Assessment Tool Used: clinical judgment Functional Limitation: Mobility: Walking and moving around Mobility: Walking and Moving Around Current Status (B1478): 0 percent impaired, limited or restricted Mobility: Walking and Moving Around Goal Status (G9562): 0 percent impaired, limited or restricted Mobility: Walking and Moving Around Discharge Status [phone]): 0 percent impaired, limited or restricted    Tawni Millers F 04/27/2015, 1:33 PM Wilkin Lippy,PT Acute Rehabilitation [phone] (pager)

## 2015-04-27 NOTE — Progress Notes (Signed)
Patient brought copies of "[facility] from 04/07/15 date.   Address is: [address] [place] [id number]  Patient gave permission for papers to be copied and added [facility].

## 2015-04-28 DIAGNOSIS — R160 Hepatomegaly, not elsewhere classified: Secondary | ICD-10-CM

## 2015-04-28 DIAGNOSIS — R1012 Left upper quadrant pain: Secondary | ICD-10-CM | POA: Diagnosis not present

## 2015-04-28 DIAGNOSIS — M2550 Pain in unspecified joint: Secondary | ICD-10-CM | POA: Diagnosis not present

## 2015-04-28 DIAGNOSIS — I864 Gastric varices: Secondary | ICD-10-CM | POA: Diagnosis present

## 2015-04-28 DIAGNOSIS — D61818 Other pancytopenia: Secondary | ICD-10-CM | POA: Diagnosis not present

## 2015-04-28 DIAGNOSIS — B182 Chronic viral hepatitis C: Secondary | ICD-10-CM | POA: Diagnosis not present

## 2015-04-28 LAB — HEPATITIS PANEL, ACUTE
HCV AB: 0.1 {s_co_ratio} (ref 0.0–0.9)
HEP A IGM: NEGATIVE
HEP B C IGM: NEGATIVE
HEP B S AG: NEGATIVE

## 2015-04-28 LAB — GLUCOSE, CAPILLARY
Glucose-Capillary: 69 mg/dL (ref 65–99)
Glucose-Capillary: 103 mg/dL — ABNORMAL HIGH (ref 65–99)

## 2015-04-28 LAB — CBC WITH DIFFERENTIAL/PLATELET
Basophils Absolute: 0 10*3/uL (ref 0.0–0.1)
Basophils Relative: 1 %
Eosinophils Absolute: 0 10*3/uL (ref 0.0–0.7)
Eosinophils Relative: 2 %
HCT: 38.8 % — ABNORMAL LOW (ref 39.0–52.0)
Hemoglobin: 12.7 g/dL — ABNORMAL LOW (ref 13.0–17.0)
Lymphocytes Relative: 39 %
Lymphs Abs: 0.5 10*3/uL — ABNORMAL LOW (ref 0.7–4.0)
MCH: 28 pg (ref 26.0–34.0)
MCHC: 32.7 g/dL (ref 30.0–36.0)
MCV: 85.7 fL (ref 78.0–100.0)
Monocytes Absolute: 0.1 10*3/uL (ref 0.1–1.0)
Monocytes Relative: 7 %
Neutro Abs: 0.7 10*3/uL — ABNORMAL LOW (ref 1.7–7.7)
Neutrophils Relative %: 51 %
Platelets: 22 10*3/uL — CL (ref 150–400)
RBC: 4.53 MIL/uL (ref 4.22–5.81)
RDW: 15.1 % (ref 11.5–15.5)
WBC: 1.3 10*3/uL — CL (ref 4.0–10.5)

## 2015-04-28 LAB — COMPREHENSIVE METABOLIC PANEL
ALBUMIN: 3.3 g/dL — AB (ref 3.5–5.0)
ALK PHOS: 41 U/L (ref 38–126)
ALT: 18 U/L (ref 17–63)
ANION GAP: 7 (ref 5–15)
AST: 12 U/L — ABNORMAL LOW (ref 15–41)
BILIRUBIN TOTAL: 2.3 mg/dL — AB (ref 0.3–1.2)
BUN: 7 mg/dL (ref 6–20)
CALCIUM: 8.4 mg/dL — AB (ref 8.9–10.3)
CO2: 25 mmol/L (ref 22–32)
Chloride: 107 mmol/L (ref 101–111)
Creatinine, Ser: 0.97 mg/dL (ref 0.61–1.24)
Glucose, Bld: 85 mg/dL (ref 65–99)
POTASSIUM: 3.7 mmol/L (ref 3.5–5.1)
Sodium: 139 mmol/L (ref 135–145)
TOTAL PROTEIN: 6.5 g/dL (ref 6.5–8.1)

## 2015-04-28 LAB — PATHOLOGIST SMEAR REVIEW

## 2015-04-28 LAB — MAGNESIUM: Magnesium: 1.8 mg/dL (ref 1.7–2.4)

## 2015-04-28 LAB — HIV ANTIBODY (ROUTINE TESTING W REFLEX): HIV Screen 4th Generation wRfx: NONREACTIVE

## 2015-04-28 MED ORDER — GABAPENTIN 600 MG PO TABS
300.0000 mg | ORAL_TABLET | Freq: Three times a day (TID) | ORAL | Status: DC
  Administered 2015-04-28 – 2015-05-02 (×12): 300 mg via ORAL
  Filled 2015-04-28 (×12): qty 1

## 2015-04-28 NOTE — Consult Note (Signed)
Referring Provider:  Ramiro Harvestaniel Thompson, MD Primary Care Physician:  No primary care provider on file. Primary Gastroenterologist:  Gentry FitzUnassigned  Reason for Consultation:  LUQ pain  HPI: Antonio Armstrong is a 40 y.o. male with cirrhosis and marked splenomegaly, in the hospital because of significant left upper quadrant pain, [place] for hepatitis C was being considered, although here, his hepatitis C antibody is very low.  The patient is said to [place] a [place] from [place], speaking only Swahili; history was obtained from the referring physician and from chart review since I did not feel that it was necessary to get an interpreter to assess the same questions again.  The patient CT scan does not show ascites, but does show evidence of hepatomegaly, varices and marked splenomegaly. On review, the liver does not appear to me to be nodular. The patient's albumin is normal at 3.4 and his INR is essentially normal at 1.52.  There is mild elevation of total bilirubin, which has not yet been fractionated. Other liver chemistries are normal.  The patient has pancytopenia, most notably leukopenia and severe thrombocytopenia.   Past Medical History  Diagnosis Date  . HCV (hepatitis C virus)   . Thrombocytopenia (HCC)   . Pancytopenia (HCC)   . Splenomegaly     No past surgical history on file.  Prior to Admission medications   Medication Sig Start Date End Date Taking? Authorizing Provider  naproxen (NAPROSYN) 500 MG tablet Take 500 mg by mouth 2 (two) times daily as needed for moderate pain.   Yes Historical Provider, MD    Current Facility-Administered Medications  Medication Dose Route Frequency Provider Last Rate Last Dose  . alum & mag hydroxide-simeth (MAALOX/MYLANTA) 200-200-20 MG/5ML suspension 30 mL  30 mL Oral Q6H PRN Lorretta HarpXilin Niu, MD      . Influenza vac split quadrivalent PF (FLUARIX) injection 0.5 mL  0.5 mL Intramuscular  Tomorrow-1000 Ramiro Harvestaniel Thompson V, MD      . morphine 2 MG/ML injection 2 mg  2 mg Intravenous Q4H PRN Lorretta HarpXilin Niu, MD   2 mg at 04/27/15 0024  . ondansetron (ZOFRAN) injection 4 mg  4 mg Intravenous Q8H PRN Lorretta HarpXilin Niu, MD   4 mg at 04/27/15 0024  . oxyCODONE (Oxy IR/ROXICODONE) immediate release tablet 5 mg  5 mg Oral Q6H PRN Lorretta HarpXilin Niu, MD   5 mg at 04/28/15 1353  . pantoprazole (PROTONIX) EC tablet 40 mg  40 mg Oral Q0600 Rodolph Bonganiel Thompson V, MD   40 mg at 04/28/15 0553  . pneumococcal 23 valent vaccine (PNU-IMMUNE) injection 0.5 mL  0.5 mL Intramuscular Tomorrow-1000 Ramiro Harvestaniel Thompson V, MD      . propranolol (INDERAL) tablet 10 mg  10 mg Oral TID Rodolph Bonganiel Thompson V, MD   10 mg at 04/28/15 1645  . sodium chloride flush (NS) 0.9 % injection 3 mL  3 mL Intravenous Q12H Lorretta HarpXilin Niu, MD   3 mL at 04/28/15 1000    Allergies as of 04/26/2015  . (No Known Allergies)    Family History  Problem Relation Age of Onset  . Headache Mother   . Bleeding Disorder Brother     Nosebleeding    Social History   Social History  . Marital Status: Single    Spouse Name: N/A  . Number of Children: N/A  . Years of Education: N/A   Occupational History  . Not on file.   Social History Main Topics  . Smoking  status: Never Smoker   . Smokeless tobacco: Not on file  . Alcohol Use: No  . Drug Use: No  . Sexual Activity: Not on file   Other Topics Concern  . Not on file   Social History Narrative    Review of Systems: Not obtained  Physical Exam: Vital signs in last 24 hours: Temp:  [97.8 F (36.6 C)-98 F (36.7 C)] 97.9 F (36.6 C) (03/09 1249) Pulse Rate:  [55-92] 55 (03/09 1249) Resp:  [18-20] 18 (03/09 1249) BP: (108-117)/(70-77) 112/77 mmHg (03/09 1249) SpO2:  [97 %-100 %] 99 % (03/09 1249) Weight:  [69.673 kg (153 lb 9.6 oz)] 69.673 kg (153 lb 9.6 oz) (03/09 1610) Last BM Date: 04/26/15  This is a very muscular African male, pleasant, alert, coherent, does not look like her typical  cirrhosis patient. No peripheral stigmata of chronic liver disease such as peripheral edema. No ascites by exam (flank tympany present). Liver span normal by scratch test, massive splenomegaly with the lower spleen tip approximately 5-7 fingerbreadths below the left costal margin. Chest clear, heart unremarkable, oropharynx benign. No evident neurologic deficits.  Intake/Output from previous day: 03/08 0701 - 03/09 0700 In: 2103.8 [P.O.:840; I.V.:1263.8] Out: 1300 [Urine:1300] Intake/Output this shift:    Lab Results:  Recent Labs  04/26/15 1932 04/27/15 0031 04/28/15 0447  WBC 1.2* 1.0* 1.3*  HGB 13.3 12.3* 12.7*  HCT 39.9 37.3* 38.8*  PLT 21* 22* 22*   BMET  Recent Labs  04/26/15 1932 04/27/15 0031 04/28/15 0447  NA 140 140 139  K 4.1 3.6 3.7  CL 103 107 107  CO2 GLUCOSE 95 80 85  BUN CREATININE 1.00 0.88 0.97  CALCIUM 8.9 8.3* 8.4*   LFT  Recent Labs  04/28/15 0447  PROT 6.5  ALBUMIN 3.3*  AST 12*  ALT 18  ALKPHOS 41  BILITOT 2.3*   PT/INR  Recent Labs  04/26/15 2100  LABPROT 18.3*  INR 1.52*    Studies/Results: Ct Abdomen Pelvis W Contrast  04/26/2015  CLINICAL DATA:  Abdominal pain and pancytopenia.  Hepatitis-C EXAM: CT ABDOMEN AND PELVIS WITH CONTRAST TECHNIQUE: Multidetector CT imaging of the abdomen and pelvis was performed using the standard protocol following bolus administration of intravenous contrast. CONTRAST:  OMNIPAQUE IOHEXOL 300 MG/ML  SOLN COMPARISON:  None. FINDINGS: The machine malfunctioned midway through the scan. The abdomen pelvis were scanned separately approximately 1 minute apart from each other. Lung bases clear.  Cardiac enlargement. Changes of cirrhosis with hepatomegaly. Irregular liver capsule compatible with scarring and cirrhosis. Marked splenomegaly. Enlarged splenic vein. Enlarged portal vein. Gastric varices. Findings consistent with portal hypertension. Gallbladder wall thickening could be due to  portal hypertension versus cholecystitis. No calcified gallstone. No biliary duct dilatation. Kidneys show no renal mass or obstruction. Normal pancreas without evidence of pancreatitis or calcification or mass. Negative for bowel obstruction.  No bowel mass or edema. Small amount of ascites in the right lower quadrant. No pelvic ascites. Urinary bladder normal.  No adenopathy. No acute skeletal abnormality. IMPRESSION: Cirrhosis with splenomegaly and portal hypertension. 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 Mild amount of ascites in the right lower quadrant. Gallbladder wall thickening may be due to portal hypertension versus cholecystitis. Correlate with pain in this area. Electronically Signed   By: Marlan Palau M.D.   On: 04/26/2015 22:52    Impression:  This case has multiple atypical features.  This patient clearly has portal hypertension. I am less convinced about  hepatomegaly (liver not palpable on physical exam), and I am also not yet convinced about cirrhosis, taking into account the CT appearance of his liver, and all his blood work findings.  Moreover, I am not accustomed to seeing such a muscular individual with advanced portal hypertension on the basis of cirrhosis.  Furthermore, it is atypical for there to be this degree of splenomegaly on the basis of cirrhosis which otherwise seems to be very well compensated. It is also atypical to have this much pain from splenomegaly associated with hepatitis C, in my experience.   I think there is at least a possibility that this patient has pre-hepatic portal hypertension, rather than cirrhosis. If that is the case, his mild bilirubin elevation would presumably be the result of Gilbert's syndrome.  Plan: 1. Obtain hepatitis C viral quantitation to see if he truly does have hepatitis C infection, as was felt to be the case when he was seen in [place]. 2. Abdominal ultrasound with Doppler evaluation of the portal vein. If available, I would also like  to obtain hepatic elastography to assess degree of fibrosis noninvasively. 3. Obtain direct bilirubin to see if this patient may have Gilbert's syndrome. 4.  Consider transjugular liver biopsy if diagnostic uncertainty persists regarding whether this patient has cirrhosis.    LOS: 2 days   Jaidin Richison V  04/28/2015, 7:50 PM   Pager [phone] If no answer or after 5 PM call [phone]

## 2015-04-28 NOTE — Progress Notes (Signed)
[facility] PROGRESS NOTE  Antonio Armstrong ZOX:[id number] DOB: October 05, 1975 DOA: 04/26/2015 PCP: No primary care provider on file.  Assessment/Plan: #1 abdominal pain Likely secondary to splenomegaly noted on CT abdomen and pelvis. CT with cirrhosis with splenomegaly and portal hypertension with gastric varices being present. Patient still complaining of epigastric and left upper abdominal pain likely capsular pain secondary to splenomegaly and portal hypertension. Patient is a refugee with no insurance at this time. Continue current pain regimen of oxycodone or morphine. GI consultation for further evaluation and management.   #2 chronic neutropenia/thrombocytopenia Chronic in nature. Likely secondary to ongoing cirrhosis and splenomegaly with portal hypertension. Patient with no overt bleeding. Patient currently afebrile. Follow.  #3 bilateral knee pain Unclear etiology. Patient with no effusions noted on examination. Patient states pain is improved. Pain management.  #4 chronic hepatitis C and cirrhosis/splenomegaly INR 1.52. Patient with no current nasal bleeding or rectal bleeding. HIV is nonreactive. Hepatitis A antibody IgM is negative. Hepatitis B surface antigen is negative. Hepatitis B core antibody IgM is negative. Hepatitis C antibody 0.1. Patient was followed in [place] by mobile physicians and copies in the paper chart. Patient still with ongoing pain likely capsular pain from splenomegaly and portal hypertension. Consult with GI for further evaluation and management. Continue PPI and low-dose propranolol.  #5 intermittent rectal bleeding Likely secondary to thrombocytopenia. Currently no bleeding. Follow H&H. Will need to follow-up with GI as outpatient.  #6 prophylaxis PPI for GI prophylaxis. SCDs for DVT prophylaxis.  Code Status: Full Family Communication: Used Swahili phone interpreter to update patient. Disposition Plan: Home once pain is controlled and counts  remained stable with outpatient follow-up with PCP and GI.   Consultants:  None  Procedures:  CT abdomen and pelvis 04/26/2015    Antibiotics:  None  HPI/Subjective: Patient states still with abdominal pain with no significant improvement. Patient states knee pain has improved. No chest pain. No shortness of breath.  Objective: Filed Vitals:   04/28/15 0140 04/28/15 0608  BP: 117/70 108/71  Pulse: 92 79  Temp: 98 F (36.7 C) 97.8 F (36.6 C)  Resp: 18 20    Intake/Output Summary (Last 24 hours) at 04/28/15 1033 Last data filed at 04/28/15 0935  Gross per 24 hour  Intake 2343.75 ml  Output   1300 ml  Net 1043.75 ml   Filed Weights   04/28/15 0608  Weight: 69.673 kg (153 lb 9.6 oz)    Exam:   General:  NAD  Cardiovascular: RRR  Respiratory: CTAB  Abdomen: Soft/TTP in epigastrium to LUQ/ND/+BS/SPLENOMEGALY  Musculoskeletal: No c/c/e   Data Reviewed: Basic Metabolic Panel:  Recent Labs Lab 04/26/15 1932 04/27/15 0031 04/28/15 0447  NA 140 140 139  K 4.1 3.6 3.7  CL 103 107 107  CO2 GLUCOSE 95 80 85  BUN CREATININE 1.00 0.88 0.97  CALCIUM 8.9 8.3* 8.4*  MG  --   --  1.8   Liver Function Tests:  Recent Labs Lab 04/26/15 1932 04/27/15 0031 04/28/15 0447  AST 16 16 12*  ALT ALKPHOS 46 37* 41  BILITOT 2.1* 2.0* 2.3*  PROT 6.9 6.4* 6.5  ALBUMIN 3.8 3.4* 3.3*    Recent Labs Lab 04/26/15 1932  LIPASE 24   No results for input(s): AMMONIA in the last 168 hours. CBC:  Recent Labs Lab 04/26/15 1932 04/27/15 0031 04/27/15 1351 04/28/15 0447  WBC 1.2* 1.0*  --  1.3*  NEUTROABS  --   --  0.6* 0.7*  HGB 13.3 12.3*  --  12.7*  HCT 39.9 37.3*  --  38.8*  MCV 85.1 85.2  --  85.7  PLT 21* 22*  --  22*   Cardiac Enzymes: No results for input(s): CKTOTAL, CKMB, CKMBINDEX, TROPONINI in the last 168 hours. BNP (last 3 results) No results for input(s): BNP in the last 8760 hours.  ProBNP (last 3  results) No results for input(s): PROBNP in the last 8760 hours.  CBG:  Recent Labs Lab 04/27/15 1229 04/28/15 0614 04/28/15 0652  GLUCAP 104* 69 103*    No results found for this or any previous visit (from the past 240 hour(s)).   Studies: Ct Abdomen Pelvis W Contrast  04/26/2015  CLINICAL DATA:  Abdominal pain and pancytopenia.  Hepatitis-C EXAM: CT ABDOMEN AND PELVIS WITH CONTRAST TECHNIQUE: Multidetector CT imaging of the abdomen and pelvis was performed using the standard protocol following bolus administration of intravenous contrast. CONTRAST:  100mL OMNIPAQUE IOHEXOL 300 MG/ML  SOLN COMPARISON:  None. FINDINGS: The machine malfunctioned midway through the scan. The abdomen pelvis were scanned separately approximately 1 minute apart from each other. Lung bases clear.  Cardiac enlargement. Changes of cirrhosis with hepatomegaly. Irregular liver capsule compatible with scarring and cirrhosis. Marked splenomegaly. Enlarged splenic vein. Enlarged portal vein. Gastric varices. Findings consistent with portal hypertension. Gallbladder wall thickening could be due to portal hypertension versus cholecystitis. No calcified gallstone. No biliary duct dilatation. Kidneys show no renal mass or obstruction. Normal pancreas without evidence of pancreatitis or calcification or mass. Negative for bowel obstruction.  No bowel mass or edema. Small amount of ascites in the right lower quadrant. No pelvic ascites. Urinary bladder normal.  No adenopathy. No acute skeletal abnormality. IMPRESSION: Cirrhosis with splenomegaly and portal hypertension. 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 Mild amount of ascites in the right lower quadrant. Gallbladder wall thickening may be due to portal hypertension versus cholecystitis. Correlate with pain in this area. Electronically Signed   By: Marlan Palauharles  Clark M.D.   On: 04/26/2015 22:52    Scheduled Meds: . Influenza vac split quadrivalent PF  0.5 mL Intramuscular Tomorrow-1000  .  pantoprazole  40 mg Oral Q0600  . pneumococcal 23 valent vaccine  0.5 mL Intramuscular Tomorrow-1000  . propranolol  10 mg Oral TID  . sodium chloride flush  3 mL Intravenous Q12H   Continuous Infusions:    Principal Problem:   Abdominal pain Active Problems:   HCV (hepatitis C virus)   Thrombocytopenia (HCC)   Pancytopenia (HCC)   Splenomegaly   GIB (gastrointestinal bleeding)   Knee pain   Chronic hepatitis C with hepatic coma (HCC)   Hepatomegaly   Gastric varices    Time spent: 35 mins    [facility] LLCHOMPSON,Aune Adami MD [facility] Pager [phone]. If 7PM-7AM, please contact night-coverage at [email or web address], password [email or web address] 04/28/2015, 10:33 AM  LOS: 2 days

## 2015-04-29 ENCOUNTER — Observation Stay (HOSPITAL_COMMUNITY): Payer: Medicaid - Out of State

## 2015-04-29 DIAGNOSIS — M25561 Pain in right knee: Secondary | ICD-10-CM | POA: Diagnosis not present

## 2015-04-29 DIAGNOSIS — R1012 Left upper quadrant pain: Secondary | ICD-10-CM | POA: Diagnosis not present

## 2015-04-29 DIAGNOSIS — D61818 Other pancytopenia: Secondary | ICD-10-CM | POA: Diagnosis not present

## 2015-04-29 DIAGNOSIS — I864 Gastric varices: Secondary | ICD-10-CM | POA: Diagnosis not present

## 2015-04-29 LAB — CBC WITH DIFFERENTIAL/PLATELET
BASOS ABS: 0 10*3/uL (ref 0.0–0.1)
Basophils Relative: 1 %
EOS PCT: 4 %
Eosinophils Absolute: 0.1 10*3/uL (ref 0.0–0.7)
HEMATOCRIT: 38.7 % — AB (ref 39.0–52.0)
HEMOGLOBIN: 12.7 g/dL — AB (ref 13.0–17.0)
LYMPHS ABS: 0.5 10*3/uL — AB (ref 0.7–4.0)
LYMPHS PCT: 40 %
MCH: 28 pg (ref 26.0–34.0)
MCHC: 32.8 g/dL (ref 30.0–36.0)
MCV: 85.2 fL (ref 78.0–100.0)
MONO ABS: 0.1 10*3/uL (ref 0.1–1.0)
MONOS PCT: 9 %
NEUTROS ABS: 0.6 10*3/uL — AB (ref 1.7–7.7)
Neutrophils Relative %: 46 %
Platelets: 24 10*3/uL — CL (ref 150–400)
RBC: 4.54 MIL/uL (ref 4.22–5.81)
RDW: 14.9 % (ref 11.5–15.5)
WBC: 1.3 10*3/uL — CL (ref 4.0–10.5)

## 2015-04-29 LAB — COMPREHENSIVE METABOLIC PANEL
ALBUMIN: 3.3 g/dL — AB (ref 3.5–5.0)
ALT: 18 U/L (ref 17–63)
AST: 15 U/L (ref 15–41)
Alkaline Phosphatase: 42 U/L (ref 38–126)
Anion gap: 9 (ref 5–15)
BILIRUBIN TOTAL: 1.8 mg/dL — AB (ref 0.3–1.2)
BUN: 6 mg/dL (ref 6–20)
CO2: 26 mmol/L (ref 22–32)
Calcium: 8.5 mg/dL — ABNORMAL LOW (ref 8.9–10.3)
Chloride: 105 mmol/L (ref 101–111)
Creatinine, Ser: 1.03 mg/dL (ref 0.61–1.24)
GFR calc Af Amer: >60 mL/min (ref >60)
GFR calc non Af Amer: >60 mL/min (ref >60)
GLUCOSE: 92 mg/dL (ref 65–99)
POTASSIUM: 3.8 mmol/L (ref 3.5–5.1)
SODIUM: 140 mmol/L (ref 135–145)
TOTAL PROTEIN: 6.5 g/dL (ref 6.5–8.1)

## 2015-04-29 LAB — GLUCOSE, CAPILLARY: Glucose-Capillary: 75 mg/dL (ref 65–99)

## 2015-04-29 LAB — BILIRUBIN, DIRECT: Bilirubin, Direct: 0.3 mg/dL (ref 0.1–0.5)

## 2015-04-29 NOTE — Progress Notes (Signed)
Case discussed with Dr. Ramiro Harvestaniel Thompson.  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  Doppler ultrasound shows normal flow in the portal and hepatic veins.  Overall, it is becoming somewhat more questionable whether this patient truly has intrinsic liver disease.  We will await his hepatitis C viral quantitation and could then consider either endoscopic evaluation to check for esophageal varices, or a transjugular liver biopsy to establish the histologic character of his liver.  Florencia Reasonsobert V. Imogean Ciampa, M.D. Pager [phone] If no answer or after 5 PM call [phone]

## 2015-04-29 NOTE — Progress Notes (Signed)
[facility] PROGRESS NOTE  Antonio Armstrong WUJ:[id number] DOB: 1975-09-16 DOA: 04/26/2015 PCP: No primary care provider on file.  Assessment/Plan: #1 abdominal pain Likely secondary to splenomegaly noted on CT abdomen and pelvis. CT with cirrhosis with splenomegaly and portal hypertension with gastric varices being present. Patient still complaining of epigastric and left upper abdominal pain likely capsular pain secondary to splenomegaly and portal hypertension. Abdominal ultrasound with Doppler evaluation of the portal vein was unremarkable and negative for any thrombosis. Patient is a refugee with no insurance at this time. Continue current pain regimen of oxycodone or morphine. GI following and appreciate input and recommendations.   #2 chronic neutropenia/thrombocytopenia Chronic in nature. Likely secondary to ongoing cirrhosis and splenomegaly with portal hypertension versus Gilbert's syndrome. Patient with no overt bleeding. Patient currently afebrile. Follow.  #3 bilateral knee pain Unclear etiology. Patient with no effusions noted on examination. Patient states pain with walking uphill and downhill. Will get plain films of the bilateral knees.   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 Patient with no current nasal bleeding or rectal bleeding. HIV is nonreactive. Hepatitis A antibody IgM is negative. Hepatitis B surface antigen is negative. Hepatitis B core antibody IgM is negative. Hepatitis C antibody 0.1. Patient was followed in [place] by mobile physicians and copies in the paper chart. Patient still with ongoing pain likely capsular pain from splenomegaly and portal hypertension. 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 Abdominal ultrasound with Doppler evaluation of the portal vein was unremarkable. GI  following and appreciate input and recommendations. Continue PPI and low-dose propranolol.  #5 intermittent rectal bleeding Likely secondary to thrombocytopenia. Currently no bleeding. Follow H&H. Will need to follow-up with GI as outpatient.  #6 prophylaxis PPI for GI prophylaxis. SCDs for DVT prophylaxis.  Code Status: Full Family Communication: Used Swahili phone interpreter to update patient. Disposition Plan: Home once pain is controlled and counts remained stable and evaluation per GI completed.   Consultants:  Gastroenterology: Dr Matthias Hughs 04/28/2015  Procedures:  CT abdomen and pelvis 04/26/2015  Abdominal ultrasound with Doppler evaluation of the portal vein 04/29/2015  Antibiotics:  None  HPI/Subjective: Patient states still with abdominal pain with no significant improvement. Patient states some knee pain. No chest pain. No shortness of breath.  Objective: Filed Vitals:   04/28/15 1249 04/29/15 0456  BP: 112/77 113/67  Pulse: 55 55  Temp: 97.9 F (36.6 C) 98.3 F (36.8 C)  Resp: 18 20    Intake/Output Summary (Last 24 hours) at 04/29/15 1212 Last data filed at 04/29/15 1044  Gross per 24 hour  Intake    960 ml  Output    200 ml  Net    760 ml   Filed Weights   04/28/15 0608 04/29/15 0456  Weight: 69.673 kg (153 lb 9.6 oz) 71.215 kg (157 lb)    Exam:   General:  NAD  Cardiovascular: RRR  Respiratory: CTAB  Abdomen: Soft/TTP in epigastrium to LUQ/ND/+BS/SPLENOMEGALY  Musculoskeletal: No c/c/e   Data Reviewed: Basic Metabolic Panel:  Recent Labs Lab 04/26/15 1932 04/27/15 0031 04/28/15 0447 04/29/15 0425  NA 140 140 139 140  K 4.1 3.6 3.7 3.8  CL 103 107 107 105  CO2 GLUCOSE 95 80 85 92  BUN CREATININE 1.00 0.88 0.97 1.03  CALCIUM 8.9 8.3*  8.4* 8.5*  MG  --   --  1.8  --    Liver Function Tests:  Recent Labs Lab 04/26/15 1932 04/27/15 0031 04/28/15 0447 04/29/15 0425  AST 16 16 12* 15  ALT 24 20  18 18   ALKPHOS 46 37* 41 42  BILITOT 2.1* 2.0* 2.3* 1.8*  PROT 6.9 6.4* 6.5 6.5  ALBUMIN 3.8 3.4* 3.3* 3.3*    Recent Labs Lab 04/26/15 1932  LIPASE 24   No results for input(s): AMMONIA in the last 168 hours. CBC:  Recent Labs Lab 04/26/15 1932 04/27/15 0031 04/27/15 1351 04/28/15 0447 04/29/15 0425  WBC 1.2* 1.0*  --  1.3* 1.3*  NEUTROABS  --   --  0.6* 0.7* 0.6*  HGB 13.3 12.3*  --  12.7* 12.7*  HCT 39.9 37.3*  --  38.8* 38.7*  MCV 85.1 85.2  --  85.7 85.2  PLT 21* 22*  --  22* 24*   Cardiac Enzymes: No results for input(s): CKTOTAL, CKMB, CKMBINDEX, TROPONINI in the last 168 hours. BNP (last 3 results) No results for input(s): BNP in the last 8760 hours.  ProBNP (last 3 results) No results for input(s): PROBNP in the last 8760 hours.  CBG:  Recent Labs Lab 04/27/15 1229 04/28/15 0614 04/28/15 0652 04/29/15 0618  GLUCAP 104* 69 103* 75    No results found for this or any previous visit (from the past 240 hour(s)).   Studies: [place] Art/ven Flow Abd Pelv Doppler  04/29/2015  CLINICAL DATA:  Splenomegaly. EXAM: DUPLEX ULTRASOUND OF LIVER TECHNIQUE: Color and duplex Doppler ultrasound was performed to evaluate the hepatic in-flow and out-flow vessels. COMPARISON:  CT scan of April 26, 2015. FINDINGS: Portal Vein Velocities Main:  43.7 cm/sec Right:  25.1 cm/sec Left:  21.6 cm/sec Normal hepatopetal flow is noted. Hepatic Vein Velocities Right:  14.2 cm/sec Middle:  38.1 cm/sec Left:  39.0 cm/sec Normal hepatofugal flow is noted. Hepatic Artery Velocity:  97.4 cm/sec Splenic Vein Velocity:  22.3 cm/sec Varices: Absent. Ascites: Minimal ascites is noted. No evidence of portal, hepatic or splenic venous thrombosis or occlusion is noted. Electronically Signed   By: Lupita RaiderJames  Green Jr, M.D.   On: 04/29/2015 10:35    Scheduled Meds: . gabapentin  300 mg Oral TID  . pantoprazole  40 mg Oral Q0600  . propranolol  10 mg Oral TID  . sodium chloride flush  3 mL Intravenous  Q12H   Continuous Infusions:    Principal Problem:   Abdominal pain Active Problems:   HCV (hepatitis C virus)   Thrombocytopenia (HCC)   Pancytopenia (HCC)   Splenomegaly   GIB (gastrointestinal bleeding)   Knee pain   Chronic hepatitis C with hepatic coma (HCC)   Hepatomegaly   Gastric varices   Arthralgia    Time spent: 35 mins    [facility] Hospital[facility]OMPSON,Niley Helbig MD [facility] Pager [phone]. If 7PM-7AM, please contact night-coverage at [email or web address], password [email or web address] 04/29/2015, 12:12 PM  LOS: 3 days

## 2015-04-30 DIAGNOSIS — B182 Chronic viral hepatitis C: Secondary | ICD-10-CM | POA: Diagnosis not present

## 2015-04-30 DIAGNOSIS — I864 Gastric varices: Secondary | ICD-10-CM | POA: Diagnosis not present

## 2015-04-30 DIAGNOSIS — D61818 Other pancytopenia: Secondary | ICD-10-CM | POA: Diagnosis not present

## 2015-04-30 DIAGNOSIS — R1012 Left upper quadrant pain: Secondary | ICD-10-CM | POA: Diagnosis not present

## 2015-04-30 LAB — HCV RNA QUANT: HCV Quantitative: NOT DETECTED IU/mL (ref >50)

## 2015-04-30 LAB — CBC
HEMATOCRIT: 39.5 % (ref 39.0–52.0)
HEMOGLOBIN: 12.5 g/dL — AB (ref 13.0–17.0)
MCH: 26.8 pg (ref 26.0–34.0)
MCHC: 31.6 g/dL (ref 30.0–36.0)
MCV: 84.6 fL (ref 78.0–100.0)
Platelets: 22 10*3/uL — CL (ref 150–400)
RBC: 4.67 MIL/uL (ref 4.22–5.81)
RDW: 14.7 % (ref 11.5–15.5)
WBC: 1.3 10*3/uL — CL (ref 4.0–10.5)

## 2015-04-30 LAB — BASIC METABOLIC PANEL
Anion gap: 9 (ref 5–15)
BUN: 8 mg/dL (ref 6–20)
CALCIUM: 8.5 mg/dL — AB (ref 8.9–10.3)
CHLORIDE: 104 mmol/L (ref 101–111)
CO2: 26 mmol/L (ref 22–32)
CREATININE: 0.88 mg/dL (ref 0.61–1.24)
GFR calc Af Amer: >60 mL/min (ref >60)
GFR calc non Af Amer: >60 mL/min (ref >60)
GLUCOSE: 91 mg/dL (ref 65–99)
Potassium: 3.6 mmol/L (ref 3.5–5.1)
Sodium: 139 mmol/L (ref 135–145)

## 2015-04-30 LAB — GLUCOSE, CAPILLARY: Glucose-Capillary: 108 mg/dL — ABNORMAL HIGH (ref 65–99)

## 2015-04-30 NOTE — Progress Notes (Signed)
Interpretor used for the assessment. According to patient he does have pain which comes and goes and radiate around back, knees are hurting too,  Difficulties in bending down and going up to stairs, will continue to monitor

## 2015-04-30 NOTE — Progress Notes (Signed)
[facility] PROGRESS NOTE  Antonio Armstrong NFA:[id number] DOB: 1975/04/25 DOA: 04/26/2015 PCP: No primary care provider on file.  Assessment/Plan: #1 abdominal pain Likely secondary to splenomegaly noted on CT abdomen and pelvis. CT with cirrhosis with splenomegaly and portal hypertension with gastric varices being present. Patient still complaining of epigastric and left upper abdominal pain likely capsular pain secondary to splenomegaly and portal hypertension. Abdominal ultrasound with Doppler evaluation of the portal vein was unremarkable and negative for any thrombosis. Patient is a refugee with no insurance at this time. Continue current pain regimen of oxycodone or morphine. GI following and appreciate input and recommendations.   #2 chronic neutropenia/thrombocytopenia Chronic in nature. Likely secondary to ongoing cirrhosis and splenomegaly with portal hypertension versus Gilbert's syndrome. Patient with no overt bleeding. Patient currently afebrile. May need outpatient follow up with heme/Onc. Follow.  #3 bilateral knee pain Unclear etiology. Patient with no effusions noted on examination. Plain films of knees with trace effusion. Patient states pain with walking uphill and downhill. Pain management.   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 Patient with no current nasal bleeding or rectal bleeding. HIV is nonreactive. Hepatitis A antibody IgM is negative. Hepatitis B surface antigen is negative. Hepatitis B core antibody IgM is negative. Hepatitis C antibody 0.1. Patient was followed in [place] by mobile physicians and copies in the paper chart. Patient still with ongoing pain likely capsular pain from splenomegaly and portal hypertension. 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 Abdominal ultrasound with  Doppler evaluation of the portal vein was unremarkable. Hepatitis C quant pending. GI following and appreciate input and recommendations. Continue PPI and low-dose propranolol.  #5 intermittent rectal bleeding Likely secondary to thrombocytopenia. Currently no bleeding. Follow H&H. Will need to follow-up with GI as outpatient.  #6 prophylaxis PPI for GI prophylaxis. SCDs for DVT prophylaxis.  Code Status: Full Family Communication: Used Swahili phone interpreter to update patient. Disposition Plan: Home once pain is controlled and counts remained stable and evaluation per GI completed.   Consultants:  Gastroenterology: Dr Matthias Hughs 04/28/2015  Procedures:  CT abdomen and pelvis 04/26/2015  Abdominal ultrasound with Doppler evaluation of the portal vein 04/29/2015  Antibiotics:  None  HPI/Subjective: Patient states still with abdominal pain with no significant improvement. Patient states some knee pain.  Objective: Filed Vitals:   04/30/15 1000 04/30/15 1200  BP: 120/80 121/76  Pulse: 60 64  Temp: 98 F (36.7 C) 98.7 F (37.1 C)  Resp:  20    Intake/Output Summary (Last 24 hours) at 04/30/15 1630 Last data filed at 04/30/15 1300  Gross per 24 hour  Intake    960 ml  Output      0 ml  Net    960 ml   Filed Weights   04/28/15 0608 04/29/15 0456 04/30/15 0510  Weight: 69.673 kg (153 lb 9.6 oz) 71.215 kg (157 lb) 71.215 kg (157 lb)    Exam:   General:  NAD  Cardiovascular: RRR  Respiratory: CTAB  Abdomen: Soft/TTP in epigastrium to LUQ/ND/+BS/SPLENOMEGALY  Musculoskeletal: No c/c/e   Data Reviewed: Basic Metabolic Panel:  Recent Labs Lab 04/26/15 1932 04/27/15 0031 04/28/15 0447 04/29/15 0425 04/30/15 0422  NA 140 140 139 140 139  K 4.1 3.6 3.7 3.8 3.6  CL 103 107 107 105 104  CO2 GLUCOSE 95  80 85 92 91  BUN 8 8 7 6 8   CREATININE 1.00 0.88 0.97 1.03 0.88  CALCIUM 8.9 8.3* 8.4* 8.5* 8.5*  MG  --   --  1.8  --   --    Liver  Function Tests:  Recent Labs Lab 04/26/15 1932 04/27/15 0031 04/28/15 0447 04/29/15 0425  AST 16 16 12* 15  ALT 24 20 18 18   ALKPHOS 46 37* 41 42  BILITOT 2.1* 2.0* 2.3* 1.8*  PROT 6.9 6.4* 6.5 6.5  ALBUMIN 3.8 3.4* 3.3* 3.3*    Recent Labs Lab 04/26/15 1932  LIPASE 24   No results for input(s): AMMONIA in the last 168 hours. CBC:  Recent Labs Lab 04/26/15 1932 04/27/15 0031 04/27/15 1351 04/28/15 0447 04/29/15 0425 04/30/15 0422  WBC 1.2* 1.0*  --  1.3* 1.3* 1.3*  NEUTROABS  --   --  0.6* 0.7* 0.6*  --   HGB 13.3 12.3*  --  12.7* 12.7* 12.5*  HCT 39.9 37.3*  --  38.8* 38.7* 39.5  MCV 85.1 85.2  --  85.7 85.2 84.6  PLT 21* 22*  --  22* 24* 22*   Cardiac Enzymes: No results for input(s): CKTOTAL, CKMB, CKMBINDEX, TROPONINI in the last 168 hours. BNP (last 3 results) No results for input(s): BNP in the last 8760 hours.  ProBNP (last 3 results) No results for input(s): PROBNP in the last 8760 hours.  CBG:  Recent Labs Lab 04/27/15 1229 04/28/15 0614 04/28/15 0652 04/29/15 0618 04/30/15 0626  GLUCAP 104* 69 103* 75 108*    No results found for this or any previous visit (from the past 240 hour(s)).   Studies: Dg Knee 1-2 Views Left  04/29/2015  CLINICAL DATA:  Bilateral knee pain, no known injury EXAM: LEFT KNEE - 1-2 VIEW COMPARISON:  None. FINDINGS: No fracture or dislocation is seen. The joint spaces are preserved. Visualized soft tissues are within normal limits. Suspected small suprapatellar knee joint effusion. IMPRESSION: No fracture or dislocation is seen. Suspected small suprapatellar knee joint effusion. Electronically Signed   By: Charline BillsSriyesh  Krishnan M.D.   On: 04/29/2015 19:07   Dg Knee 1-2 Views Right  04/29/2015  CLINICAL DATA:  Bilateral knee pain, no known injury. EXAM: RIGHT KNEE - 1-2 VIEW COMPARISON:  None. FINDINGS: There is no evidence of fracture or dislocation. There is no evidence of arthropathy or other focal bone abnormality. Trace  effusion. Soft tissues are unremarkable. IMPRESSION: Trace effusion.  No bony abnormality. Electronically Signed   By: Rubye OaksMelanie  Ehinger M.D.   On: 04/29/2015 19:08   [place] Art/ven Flow Abd Pelv Doppler  04/29/2015  CLINICAL DATA:  Splenomegaly. EXAM: DUPLEX ULTRASOUND OF LIVER TECHNIQUE: Color and duplex Doppler ultrasound was performed to evaluate the hepatic in-flow and out-flow vessels. COMPARISON:  CT scan of April 26, 2015. FINDINGS: Portal Vein Velocities Main:  43.7 cm/sec Right:  25.1 cm/sec Left:  21.6 cm/sec Normal hepatopetal flow is noted. Hepatic Vein Velocities Right:  14.2 cm/sec Middle:  38.1 cm/sec Left:  39.0 cm/sec Normal hepatofugal flow is noted. Hepatic Artery Velocity:  97.4 cm/sec Splenic Vein Velocity:  22.3 cm/sec Varices: Absent. Ascites: Minimal ascites is noted. No evidence of portal, hepatic or splenic venous thrombosis or occlusion is noted. Electronically Signed   By: Lupita RaiderJames  Green Jr, M.D.   On: 04/29/2015 10:35    Scheduled Meds: . gabapentin  300 mg Oral TID  . pantoprazole  40 mg Oral Q0600  . propranolol  10 mg Oral  TID  . sodium chloride flush  3 mL Intravenous Q12H   Continuous Infusions:    Principal Problem:   Abdominal pain Active Problems:   HCV (hepatitis C virus)   Thrombocytopenia (HCC)   Pancytopenia (HCC)   Splenomegaly   GIB (gastrointestinal bleeding)   Knee pain   Chronic hepatitis C with hepatic coma (HCC)   Hepatomegaly   Gastric varices   Arthralgia    Time spent: 35 mins    [facility] MD [facility] Pager [phone]. If 7PM-7AM, please contact night-coverage at [email or web address], password [facility] 04/30/2015, 4:30 PM  LOS: 4 days

## 2015-04-30 NOTE — Progress Notes (Signed)
The hepatitis C quant is pending. 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 Etiology unclear at this point however. Given low platlett count would be hesitant to do liver biopsy. Could consider Right upper quadrant ultrasound with elastography as another method to evaluate.

## 2015-05-01 DIAGNOSIS — I864 Gastric varices: Secondary | ICD-10-CM | POA: Diagnosis not present

## 2015-05-01 DIAGNOSIS — D72819 Decreased white blood cell count, unspecified: Secondary | ICD-10-CM | POA: Diagnosis not present

## 2015-05-01 DIAGNOSIS — D696 Thrombocytopenia, unspecified: Secondary | ICD-10-CM

## 2015-05-01 DIAGNOSIS — K7460 Unspecified cirrhosis of liver: Secondary | ICD-10-CM

## 2015-05-01 DIAGNOSIS — D709 Neutropenia, unspecified: Secondary | ICD-10-CM | POA: Insufficient documentation

## 2015-05-01 DIAGNOSIS — K766 Portal hypertension: Secondary | ICD-10-CM

## 2015-05-01 DIAGNOSIS — R161 Splenomegaly, not elsewhere classified: Secondary | ICD-10-CM

## 2015-05-01 DIAGNOSIS — R1012 Left upper quadrant pain: Secondary | ICD-10-CM | POA: Diagnosis not present

## 2015-05-01 DIAGNOSIS — R160 Hepatomegaly, not elsewhere classified: Secondary | ICD-10-CM | POA: Diagnosis not present

## 2015-05-01 LAB — BASIC METABOLIC PANEL
Anion gap: 7 (ref 5–15)
BUN: 9 mg/dL (ref 6–20)
CALCIUM: 8.5 mg/dL — AB (ref 8.9–10.3)
CHLORIDE: 105 mmol/L (ref 101–111)
CO2: 24 mmol/L (ref 22–32)
CREATININE: 0.89 mg/dL (ref 0.61–1.24)
GFR calc Af Amer: >60 mL/min (ref >60)
GFR calc non Af Amer: >60 mL/min (ref >60)
Glucose, Bld: 91 mg/dL (ref 65–99)
Potassium: 3.8 mmol/L (ref 3.5–5.1)
SODIUM: 136 mmol/L (ref 135–145)

## 2015-05-01 LAB — CBC
HEMATOCRIT: 37.6 % — AB (ref 39.0–52.0)
HEMOGLOBIN: 12.3 g/dL — AB (ref 13.0–17.0)
MCH: 27.4 pg (ref 26.0–34.0)
MCHC: 32.7 g/dL (ref 30.0–36.0)
MCV: 83.7 fL (ref 78.0–100.0)
Platelets: 30 10*3/uL — ABNORMAL LOW (ref 150–400)
RBC: 4.49 MIL/uL (ref 4.22–5.81)
RDW: 14.8 % (ref 11.5–15.5)
WBC: 1.3 10*3/uL — CL (ref 4.0–10.5)

## 2015-05-01 LAB — HEPATIC FUNCTION PANEL
ALBUMIN: 3.4 g/dL — AB (ref 3.5–5.0)
ALK PHOS: 42 U/L (ref 38–126)
ALT: 18 U/L (ref 17–63)
AST: 15 U/L (ref 15–41)
BILIRUBIN DIRECT: 0.3 mg/dL (ref 0.1–0.5)
BILIRUBIN TOTAL: 1.7 mg/dL — AB (ref 0.3–1.2)
Indirect Bilirubin: 1.4 mg/dL — ABNORMAL HIGH (ref 0.3–0.9)
Total Protein: 6.7 g/dL (ref 6.5–8.1)

## 2015-05-01 NOTE — Progress Notes (Addendum)
04/30/15 CM alerted MD to pt's status as pt's care does not require continued hospitalization. 05/01/15 MD notified CM pt is a refugee and is working on pt's disposition. 05/01/15 Cm received instruction from Medical Director to issue ABN.  Cm met with pt to give ABN.  CM called [employer] as pt only speaks swahili.  Cm spent 40+ minutes with interpreter, pt and ABN form explanation.  Pt has Medicaid in [place] but resides here in [place]. Pt has not applied for Medicaid in this state.  Pt does NOT have Medicare but this CM was instructed to deliver an ABN.  Despite [employer]' efforts, this patient inability to understand the ramifications of any of the ABN options according to PI ([place] to country and not able to understand how the medical system works) pt was willing to sign receipt of ABN but not choose an option and defers to his MD's advice.  CM notified MD.

## 2015-05-01 NOTE — Consult Note (Signed)
Reason for Referral: Thrombocytopenia in leukocytopenia.   HPI: 40 year old native of [place] and recently relocated to this area via [place]. He has known history of liver disease including cirrhosis of the liver for presumed hepatitis C. He was hospitalized on 04/26/2015 after presenting with abdominal pain and rectal bleeding. He was noted to have leukocytopenia and thrombocytopenia on admission and very mild anemia. His white cell count on admission was 1.2 with platelet count of 21. His hemoglobin was 13 with hydration currently at 12.5. His differential of white cells was normal. His smear morphology was unremarkable. Over the last few days, his white cell count and a persistently low 1.3 with an absolute neutrophil count around 600. His platelet count currently is around 30. No active bleeding noted at this time. There is no easy bruisability or petechiae. His main complaint is predominantly knee pain and abdominal pain.  His review of system was difficult to obtain because of language barrier. But denied severe bleeding episodes of hemoptysis or hematemesis. His appetite remains reasonable without weight loss. No fevers or chills noted. No lymphadenopathy or other constitutional symptoms.   Past Medical History  Diagnosis Date  . HCV (hepatitis C virus)   . Thrombocytopenia ([place])   . Pancytopenia ([place])   . Splenomegaly   :  No past surgical history on file.:   Current facility-administered medications:  .  alum & mag hydroxide-simeth (MAALOX/MYLANTA) 200-200-20 MG/5ML suspension 30 mL, 30 mL, Oral, Q6H PRN, Ivor Costa, MD .  gabapentin (NEURONTIN) tablet 300 mg, 300 mg, Oral, TID, Eugenie Filler, MD, 300 mg at 05/01/15 0908 .  morphine 2 MG/ML injection 2 mg, 2 mg, Intravenous, Q4H PRN, Ivor Costa, MD, 2 mg at 04/27/15 0024 .  ondansetron (ZOFRAN) injection 4 mg, 4 mg, Intravenous, Q8H PRN, Ivor Costa, MD, 4 mg at 04/27/15 0024 .  oxyCODONE (Oxy IR/ROXICODONE) immediate  release tablet 5 mg, 5 mg, Oral, Q6H PRN, Ivor Costa, MD, 5 mg at 05/01/15 1421 .  pantoprazole (PROTONIX) EC tablet 40 mg, 40 mg, Oral, Q0600, Eugenie Filler, MD, 40 mg at 05/01/15 0500 .  propranolol (INDERAL) tablet 10 mg, 10 mg, Oral, TID, Eugenie Filler, MD, 10 mg at 05/01/15 0908 .  sodium chloride flush (NS) 0.9 % injection 3 mL, 3 mL, Intravenous, Q12H, Ivor Costa, MD, 3 mL at 05/01/15 0920:  No Known Allergies:  Family History  Problem Relation Age of Onset  . Headache Mother   . Bleeding Disorder Brother     Nosebleeding  :  Social History   Social History  . Marital Status: Single    Spouse Name: N/A  . Number of Children: N/A  . Years of Education: N/A   Occupational History  . Not on file.   Social History Main Topics  . Smoking status: Never Smoker   . Smokeless tobacco: Not on file  . Alcohol Use: No  . Drug Use: No  . Sexual Activity: Not on file   Other Topics Concern  . Not on file   Social History Narrative  :  Pertinent items are noted in HPI.  Exam: ECOG 0 Blood pressure 121/83, pulse 57, temperature 98.4 F (36.9 C), temperature source Oral, resp. rate 20, weight 157 lb (71.215 kg), SpO2 100 %. General appearance: alert and cooperative healthy-appearing gentleman without distress. Head: Normocephalic, without obvious abnormality Throat: lips, mucosa, and tongue normal; teeth and gums normal. No mucosal bleeding noted. Neck: no adenopathy Back: negative Resp:  clear to auscultation bilaterally Cardio: regular rate and rhythm, S1, S2 normal, no murmur, click, rub or gallop GI: soft, non-tender; bowel sounds normal; no masses,  slightly distended with shifting dullness and palpable splenomegaly. Extremities: extremities normal, atraumatic, no cyanosis or edema Pulses: 2+ and symmetric Skin: Skin color, texture, turgor normal. No rashes or lesions Lymph nodes: Cervical, supraclavicular, and axillary nodes normal.   Recent Labs   04/30/15 0422 05/01/15 0353  WBC 1.3* 1.3*  HGB 12.5* 12.3*  HCT 39.5 37.6*  PLT 22* 30*    Recent Labs  04/30/15 0422 05/01/15 0353  NA 139 136  K 3.6 3.8  CL 104 105  CO2 26 24  GLUCOSE 91 91  BUN 8 9  CREATININE 0.88 0.89  CALCIUM 8.5* 8.5*     Blood smear review: Normal morphology with mild thrombocytopenia and leukocytopenia.    Ct Abdomen Pelvis W Contrast  04/26/2015  CLINICAL DATA:  Abdominal pain and pancytopenia.  Hepatitis-C EXAM: CT ABDOMEN AND PELVIS WITH CONTRAST TECHNIQUE: Multidetector CT imaging of the abdomen and pelvis was performed using the standard protocol following bolus administration of intravenous contrast. CONTRAST:  132m OMNIPAQUE IOHEXOL 300 MG/ML  SOLN COMPARISON:  None. FINDINGS: The machine malfunctioned midway through the scan. The abdomen pelvis were scanned separately approximately 1 minute apart from each other. Lung bases clear.  Cardiac enlargement. Changes of cirrhosis with hepatomegaly. Irregular liver capsule compatible with scarring and cirrhosis. Marked splenomegaly. Enlarged splenic vein. Enlarged portal vein. Gastric varices. Findings consistent with portal hypertension. Gallbladder wall thickening could be due to portal hypertension versus cholecystitis. No calcified gallstone. No biliary duct dilatation. Kidneys show no renal mass or obstruction. Normal pancreas without evidence of pancreatitis or calcification or mass. Negative for bowel obstruction.  No bowel mass or edema. Small amount of ascites in the right lower quadrant. No pelvic ascites. Urinary bladder normal.  No adenopathy. No acute skeletal abnormality. IMPRESSION: Cirrhosis with splenomegaly and portal hypertension. 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 Mild amount of ascites in the right lower quadrant. Gallbladder wall thickening may be due to portal hypertension versus cholecystitis. Correlate with pain in this area. Electronically Signed   By: CFranchot GalloM.D.   On: 04/26/2015  22:52   [place]/ven Flow Abd Pelv Doppler  04/29/2015  CLINICAL DATA:  Splenomegaly. EXAM: DUPLEX ULTRASOUND OF LIVER TECHNIQUE: Color and duplex Doppler ultrasound was performed to evaluate the hepatic in-flow and out-flow vessels. COMPARISON:  CT scan of April 26, 2015. FINDINGS: Portal Vein Velocities Main:  43.7 cm/sec Right:  25.1 cm/sec Left:  21.6 cm/sec Normal hepatopetal flow is noted. Hepatic Vein Velocities Right:  14.2 cm/sec Middle:  38.1 cm/sec Left:  39.0 cm/sec Normal hepatofugal flow is noted. Hepatic Artery Velocity:  97.4 cm/sec Splenic Vein Velocity:  22.3 cm/sec Varices: Absent. Ascites: Minimal ascites is noted. No evidence of portal, hepatic or splenic venous thrombosis or occlusion is noted. Electronically Signed   By: JMarijo Conception M.D.   On: 04/29/2015 10:35    Assessment and Plan:    40year old gentleman with the following issues:  1.  Leuckoytopenia and leukocytopenia: This is noted in the setting of cirrhosis of the liver, portal hypertension and splenomegaly. The differential diagnosis includes splenic sequestration as the likely etiology. Myeloproliferative disorder, myelodysplastic syndrome or hematological malignancy is extremely unlikely.  His CT scan does not suggest any lymphadenopathy to support the diagnosis of lymphoma. His peripheral smear has no findings of a hemolytic process or schistocytes. Condition such as PNH,  hairy cell leukemia or splenic lymphoma is extremely unlikely.  I do not recommend further hematological investigation such as bone marrow biopsy or further imaging.  I think his thrombocytopenia leukocytopenia is consistent with his degree of portal hypertension related to his liver disease.  2. Infection risk: His neutrophil count is adequate at this time to prevent recurrent infections and does not require any growth factor support.  3. Bleeding risk: I do not recommend any transfusion at this time however he is at increased risk of  bleeding with his thrombocytopenia and liver disease. His PT is elevated mildly and his PTT is normal. If a biopsy is needed I would recommend transfusing him with platelets above 50 to ensure bleeding prophylaxis.  4. Cirrhosis of the liver: Etiology is unclear at this time although there is possibility of hepatitis C. He is followed by gastroenterology regarding this issue.

## 2015-05-01 NOTE — Progress Notes (Signed)
[facility] PROGRESS NOTE  Antonio Armstrong ZOX:[id number] DOB: 01/23/1976 DOA: 04/26/2015 PCP: No primary care provider on file.  Assessment/Plan: #1 abdominal pain Likely secondary to splenomegaly noted on CT abdomen and pelvis. CT with cirrhosis with splenomegaly and portal hypertension with gastric varices being present. Patient still complaining of epigastric and left upper abdominal pain likely capsular pain secondary to splenomegaly and portal hypertension. Abdominal ultrasound with Doppler evaluation of the portal vein was unremarkable and negative for any thrombosis. Patient is a refugee with no insurance at this time. Continue current pain regimen of oxycodone or morphine. GI following and appreciate input and recommendations.   #2 neutropenia/thrombocytopenia  Likely secondary to ongoing cirrhosis and splenomegaly with portal hypertension versus Gilbert's syndrome. Patient with no overt bleeding. Patient with severe neutropenia with WBC 1.3 and platelets of 30. Patient currently afebrile. Consult with hematology for further evaluation and management.  #3 bilateral knee pain Unclear etiology. Patient with no effusions noted on examination. Plain films of knees with trace effusion. Patient states pain with walking uphill and downhill. Pain management.   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 Patient with no current nasal bleeding or rectal bleeding. HIV is nonreactive. Hepatitis A antibody IgM is negative. Hepatitis B surface antigen is negative. Hepatitis B core antibody IgM is negative. Hepatitis C antibody 0.1. Patient was followed in [place] by mobile physicians and copies in the paper chart. Patient still with ongoing pain likely capsular pain from splenomegaly and portal hypertension. 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 Patient also with radiographic and clinical signs of cirrhosis. Abdominal ultrasound with Doppler evaluation of the portal vein was unremarkable. Hepatitis C quant negative GI following and appreciate input and recommendations. Continue PPI and low-dose propranolol.  #5 intermittent rectal bleeding Likely secondary to thrombocytopenia. Currently no bleeding. Follow H&H. Will need to follow-up with GI as outpatient.  #6 prophylaxis PPI for GI prophylaxis. SCDs for DVT prophylaxis.  Code Status: Full Family Communication: Used Swahili phone interpreter to update patient. Disposition Plan: Home once pain is controlled and counts remained stable and evaluation per GI completed and hematology.   Consultants:  Gastroenterology: Dr Matthias HughsBuccini 04/28/2015  Hematology pending  Procedures:  CT abdomen and pelvis 04/26/2015  Abdominal ultrasound with Doppler evaluation of the portal vein 04/29/2015  Antibiotics:  None  HPI/Subjective: Patient states still with abdominal pain with some improvement. Patient states some knee pain. Patient denies bleeding.  Objective: Filed Vitals:   05/01/15 0908 05/01/15 1220  BP: 120/80 121/83  Pulse: 60 57  Temp:  98.4 F (36.9 C)  Resp: 20 20    Intake/Output Summary (Last 24 hours) at 05/01/15 1503 Last data filed at 05/01/15 1430  Gross per 24 hour  Intake    960 ml  Output      0 ml  Net    960 ml   Filed Weights   04/29/15 0456 04/30/15 0510 05/01/15 0500  Weight: 71.215 kg (157 lb) 71.215 kg (157 lb) 71.215 kg (157 lb)    Exam:   General:  NAD  Cardiovascular: RRR  Respiratory: CTAB  Abdomen: Soft/NTTP in epigastrium to LUQ/ND/+BS/SPLENOMEGALY  Musculoskeletal: No c/c/e   Data Reviewed: Basic Metabolic Panel:  Recent Labs Lab 04/27/15 0031 04/28/15 0447 04/29/15 0425 04/30/15 0422 05/01/15 0353  NA 140 139 140 139 136  K 3.6 3.7 3.8  3.6 3.8  CL 107 107 105 104 105  CO2 GLUCOSE  80 85 92 91 91  BUN CREATININE 0.88 0.97 1.03 0.88 0.89  CALCIUM 8.3* 8.4* 8.5* 8.5* 8.5*  MG  --  1.8  --   --   --    Liver Function Tests:  Recent Labs Lab 04/26/15 1932 04/27/15 0031 04/28/15 0447 May 29, 2015 0425 05/01/15 0353  AST 16 16 12* 15 15  ALT ALKPHOS 46 37* 41 42 42  BILITOT 2.1* 2.0* 2.3* 1.8* 1.7*  PROT 6.9 6.4* 6.5 6.5 6.7  ALBUMIN 3.8 3.4* 3.3* 3.3* 3.4*    Recent Labs Lab 04/26/15 1932  LIPASE 24   No results for input(s): AMMONIA in the last 168 hours. CBC:  Recent Labs Lab 04/27/15 0031 04/27/15 1351 04/28/15 0447 05/29/15 0425 04/30/15 0422 05/01/15 0353  WBC 1.0*  --  1.3* 1.3* 1.3* 1.3*  NEUTROABS  --  0.6* 0.7* 0.6*  --   --   HGB 12.3*  --  12.7* 12.7* 12.5* 12.3*  HCT 37.3*  --  38.8* 38.7* 39.5 37.6*  MCV 85.2  --  85.7 85.2 84.6 83.7  PLT 22*  --  22* 24* 22* 30*   Cardiac Enzymes: No results for input(s): CKTOTAL, CKMB, CKMBINDEX, TROPONINI in the last 168 hours. BNP (last 3 results) No results for input(s): BNP in the last 8760 hours.  ProBNP (last 3 results) No results for input(s): PROBNP in the last 8760 hours.  CBG:  Recent Labs Lab 04/27/15 1229 04/28/15 0614 04/28/15 0652 05/29/15 0618 04/30/15 0626  GLUCAP 104* 69 103* 75 108*    No results found for this or any previous visit (from the past 240 hour(s)).   Studies: Dg Knee 1-2 Views Left  29-May-2015  CLINICAL DATA:  Bilateral knee pain, no known injury EXAM: LEFT KNEE - 1-2 VIEW COMPARISON:  None. FINDINGS: No fracture or dislocation is seen. The joint spaces are preserved. Visualized soft tissues are within normal limits. Suspected small suprapatellar knee joint effusion. IMPRESSION: No fracture or dislocation is seen. Suspected small suprapatellar knee joint effusion. Electronically Signed   By: Charline Bills M.D.   On: 05/29/2015 19:07   Dg Knee 1-2 Views Right  05/29/15  CLINICAL DATA:  Bilateral knee pain, no known  injury. EXAM: RIGHT KNEE - 1-2 VIEW COMPARISON:  None. FINDINGS: There is no evidence of fracture or dislocation. There is no evidence of arthropathy or other focal bone abnormality. Trace effusion. Soft tissues are unremarkable. IMPRESSION: Trace effusion.  No bony abnormality. Electronically Signed   By: Rubye Oaks M.D.   On: 05/29/2015 19:08    Scheduled Meds: . gabapentin  300 mg Oral TID  . pantoprazole  40 mg Oral Q0600  . propranolol  10 mg Oral TID  . sodium chloride flush  3 mL Intravenous Q12H   Continuous Infusions:    Principal Problem:   Abdominal pain Active Problems:   HCV (hepatitis C virus)   Thrombocytopenia ([facility])   Pancytopenia ([facility])   Splenomegaly   GIB (gastrointestinal bleeding)   Knee pain   Chronic hepatitis C with hepatic coma ([facility])   Hepatomegaly   Gastric varices   Arthralgia    Time spent: 35 mins    [facility] MD [facility] Pager [phone]. If 7PM-7AM, please contact night-coverage at [email or web address], password [email or web address] 05/01/2015, 3:03 PM  LOS: 5 days

## 2015-05-02 ENCOUNTER — Encounter (HOSPITAL_COMMUNITY): Payer: Self-pay | Admitting: Nurse Practitioner

## 2015-05-02 ENCOUNTER — Telehealth: Payer: Self-pay

## 2015-05-02 DIAGNOSIS — R1012 Left upper quadrant pain: Secondary | ICD-10-CM | POA: Diagnosis not present

## 2015-05-02 DIAGNOSIS — R160 Hepatomegaly, not elsewhere classified: Secondary | ICD-10-CM | POA: Diagnosis not present

## 2015-05-02 DIAGNOSIS — M2550 Pain in unspecified joint: Secondary | ICD-10-CM | POA: Diagnosis not present

## 2015-05-02 DIAGNOSIS — I864 Gastric varices: Secondary | ICD-10-CM | POA: Diagnosis not present

## 2015-05-02 LAB — CBC WITH DIFFERENTIAL/PLATELET
BASOS ABS: 0 10*3/uL (ref 0.0–0.1)
Basophils Relative: 2 %
EOS ABS: 0.1 10*3/uL (ref 0.0–0.7)
Eosinophils Relative: 5 %
HCT: 40.8 % (ref 39.0–52.0)
HEMOGLOBIN: 13.1 g/dL (ref 13.0–17.0)
LYMPHS ABS: 0.5 10*3/uL — AB (ref 0.7–4.0)
LYMPHS PCT: 42 %
MCH: 27.1 pg (ref 26.0–34.0)
MCHC: 32.1 g/dL (ref 30.0–36.0)
MCV: 84.3 fL (ref 78.0–100.0)
Monocytes Absolute: 0.1 10*3/uL (ref 0.1–1.0)
Monocytes Relative: 11 %
NEUTROS ABS: 0.6 10*3/uL — AB (ref 1.7–7.7)
Neutrophils Relative %: 40 %
Platelets: 24 10*3/uL — CL (ref 150–400)
RBC: 4.84 MIL/uL (ref 4.22–5.81)
RDW: 14.7 % (ref 11.5–15.5)
Smear Review: DECREASED
WBC: 1.3 10*3/uL — AB (ref 4.0–10.5)

## 2015-05-02 LAB — COMPREHENSIVE METABOLIC PANEL
ALBUMIN: 3.6 g/dL (ref 3.5–5.0)
ALK PHOS: 41 U/L (ref 38–126)
ALT: 22 U/L (ref 17–63)
ANION GAP: 10 (ref 5–15)
AST: 16 U/L (ref 15–41)
BILIRUBIN TOTAL: 2 mg/dL — AB (ref 0.3–1.2)
BUN: 10 mg/dL (ref 6–20)
CALCIUM: 9.1 mg/dL (ref 8.9–10.3)
CO2: 27 mmol/L (ref 22–32)
Chloride: 102 mmol/L (ref 101–111)
Creatinine, Ser: 0.78 mg/dL (ref 0.61–1.24)
GFR calc Af Amer: >60 mL/min (ref >60)
GLUCOSE: 83 mg/dL (ref 65–99)
Potassium: 3.7 mmol/L (ref 3.5–5.1)
Sodium: 139 mmol/L (ref 135–145)
TOTAL PROTEIN: 7 g/dL (ref 6.5–8.1)

## 2015-05-02 LAB — CRYOGLOBULIN

## 2015-05-02 MED ORDER — OXYCODONE HCL 5 MG PO TABS: 5.0000 mg | ORAL_TABLET | Freq: Four times a day (QID) | ORAL | Status: DC | PRN

## 2015-05-02 MED ORDER — PANTOPRAZOLE SODIUM 40 MG PO TBEC: 40.0000 mg | DELAYED_RELEASE_TABLET | Freq: Every day | ORAL | Status: DC

## 2015-05-02 MED ORDER — GABAPENTIN 600 MG PO TABS: 300.0000 mg | ORAL_TABLET | Freq: Three times a day (TID) | ORAL | Status: DC

## 2015-05-02 MED ORDER — PROPRANOLOL HCL 10 MG PO TABS: 10.0000 mg | ORAL_TABLET | Freq: Three times a day (TID) | ORAL | Status: DC

## 2015-05-02 NOTE — Progress Notes (Signed)
Patient given discharge instructions via interpreter and all questions answered.  IV removed, prescriptions given to patient along with being faxed to pharmacy.  Patient made aware that he can get all prescriptions except pain medicine once at the [facility] tomorrow for free.  Patient discharged via wheelchair with all belongings.

## 2015-05-02 NOTE — Progress Notes (Signed)
[facility] Progress Note  Subjective: Through an interpreter, patient says that his left upper quadrant pain is much better  Objective: Vital signs in last 24 hours: Temp:  [97.7 F (36.5 C)-98.4 F (36.9 C)] 97.7 F (36.5 C) (03/13 0611) Pulse Rate:  [57-60] 58 (03/13 0611) Resp:  [20] 20 (03/13 0611) BP: (113-121)/(80-87) 113/87 mmHg (03/13 0611) SpO2:  [100 %] 100 % (03/13 0611) Weight:  [70.444 kg (155 lb 4.8 oz)] 70.444 kg (155 lb 4.8 oz) (03/13 0611) Weight change: -0.771 kg (-1 lb 11.2 oz)   XB:JYNWGNFAOPE:unchanged  Lab Results: Results for orders placed or performed during the hospital encounter of 04/26/15 (from the past 24 hour(s))  Comprehensive metabolic panel     Status: Abnormal   Collection Time: 05/02/15  6:02 AM  Result Value Ref Range   Sodium 139 135 - 145 mmol/L   Potassium 3.7 3.5 - 5.1 mmol/L   Chloride 102 101 - 111 mmol/L   CO2 27 22 - 32 mmol/L   Glucose, Bld 83 65 - 99 mg/dL   BUN 10 6 - 20 mg/dL   Creatinine, Ser 1.300.78 0.61 - 1.24 mg/dL   Calcium 9.1 8.9 - 86.510.3 mg/dL   Total Protein 7.0 6.5 - 8.1 g/dL   Albumin 3.6 3.5 - 5.0 g/dL   AST 16 15 - 41 U/L   ALT 22 17 - 63 U/L   Alkaline Phosphatase 41 38 - 126 U/L   Total Bilirubin 2.0 (H) 0.3 - 1.2 mg/dL   GFR calc non Af Amer >60 >60 mL/min   GFR calc Af Amer >60 >60 mL/min   Anion gap 10 5 - 15  CBC with Differential/Platelet     Status: Abnormal (Preliminary result)   Collection Time: 05/02/15  6:02 AM  Result Value Ref Range   WBC 1.3 (LL) 4.0 - 10.5 K/uL   RBC 4.84 4.22 - 5.81 MIL/uL   Hemoglobin 13.1 13.0 - 17.0 g/dL   HCT 78.440.8 69.639.0 - 29.552.0 %   MCV 84.3 78.0 - 100.0 fL   MCH 27.1 26.0 - 34.0 pg   MCHC 32.1 30.0 - 36.0 g/dL   RDW 28.414.7 13.211.5 - 44.015.5 %   Platelets PENDING 150 - 400 K/uL   Neutrophils Relative % PENDING %   Neutro Abs PENDING 1.7 - 7.7 K/uL   Band Neutrophils PENDING %   Lymphocytes Relative PENDING %   Lymphs Abs PENDING 0.7 - 4.0 K/uL   Monocytes Relative PENDING %    Monocytes Absolute PENDING 0.1 - 1.0 K/uL   Eosinophils Relative PENDING %   Eosinophils Absolute PENDING 0.0 - 0.7 K/uL   Basophils Relative PENDING %   Basophils Absolute PENDING 0.0 - 0.1 K/uL   WBC Morphology PENDING    RBC Morphology PENDING    Smear Review PENDING    nRBC PENDING 0 /100 WBC   Metamyelocytes Relative PENDING %   Myelocytes PENDING %   Promyelocytes Absolute PENDING %   Blasts PENDING %    Studies/Results: No results found.    Assessment: Likely cirrhosis, would appear idiopathic. I'll hepatitis C studies here are negative.  Plan: Patient has been started on a beta blocker appropriately to decrease portal hypertension. Hematologic evaluation in progress but apparently little workup recommended. And not sure what else specifically to recommend as inpatient from a GI standpoint. When discharged, will need to followup with Dr. Matthias HughsBuccini.   [name] C 05/02/2015, 10:10 AM  Pager [phone] If no answer or after 5 PM  call [phone]

## 2015-05-02 NOTE — Progress Notes (Signed)
CSW provided pt with taxi voucher. 

## 2015-05-02 NOTE — Care Management Note (Signed)
Case Management Note  Patient Details  Name: Antonio Armstrong MRN: [id number] Date of Birth: 05/20/1975  Subjective/Objective:        Abdominal pain            Action/Plan:  NCM spoke to pt via interpreter line [phone] for Swahili. NCM went over [facility] at 4:30 pm. Pt states he does not work and no resources to pay for meds. Pt [place]. [phone]. [facility]. Pt needs to have a PCP prior to coming to the clinic to follow up on Liver Disease. Contacted [facility] and pt can pick up his meds tomorrow from the pharmacy, a one time free fill. [facility] and [facility] given to pt.    Expected Discharge Date:  05/02/2015               Expected Discharge Plan:  Home/Self Care  In-House Referral:  NA  Discharge planning Services  CM Consult, Medication Assistance, Indigent Health Clinic  Post Acute Care Choice:  NA Choice offered to:  NA  DME Arranged:  N/A DME Agency:  NA  HH Arranged:  NA HH Agency:  NA  Status of Service:  Completed, signed off  Medicare Important Message Given:    Date Medicare IM Given:    Medicare IM give by:    Date Additional Medicare IM Given:    Additional Medicare Important Message give by:     If discussed at Long Length of Stay Meetings, dates discussed:    Additional Comments:  Elliot CousinShavis, Aibhlinn Kalmar Ellen, RN 05/02/2015, 4:26 PM

## 2015-05-02 NOTE — Discharge Summary (Signed)
Physician Discharge Summary  Latham Kinzler JJO:[id number] DOB: 1975-07-16 DOA: 04/26/2015  PCP: No primary care provider on file.  Admit date: 04/26/2015 Discharge date: 05/02/2015  Time spent: 65 minutes  Recommendations for Outpatient Follow-up:  1. [facility] as scheduled. On follow-up patient likely needs a comprehensive metabolic profile done to follow-up on electrolytes renal function LFTs. Patient also likely need a CBC with differential done to follow-up on his counts. Patient also needs a referral to hepatology/GI for further management.   Discharge Diagnoses:  Principal Problem:   Abdominal pain Active Problems:   HCV (hepatitis C virus)   Thrombocytopenia (HCC)   Pancytopenia (HCC)   Splenomegaly   GIB (gastrointestinal bleeding)   Knee pain   Chronic hepatitis C with hepatic coma (HCC)   Hepatomegaly   Gastric varices   Arthralgia   Neutropenia (HCC)   Discharge Condition: Stable and improved  Diet recommendation: Regular  Filed Weights   04/30/15 0510 05/01/15 0500 05/02/15 0611  Weight: 71.215 kg (157 lb) 71.215 kg (157 lb) 70.444 kg (155 lb 4.8 oz)    History of present illness:  Per Dr Heather Roberts is a 40 y.o. male with PMH of HCV, pancytopenia, abdominal pain, joint pain over both knee joint and ankles, epistaxis, who presented with abdominal pain, knee pain and rectal bleeding.  Pt is a refugee from [place]. He dose not speak [place]. 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 Medical history is obtained through translator on the phone. Pt has been recently staying in [place], [place] to receive some medical treatment and vaccination in [place]. In [place], he was followed by [facility] gastroenterologist last year. The note from 12/2014 indicated that specialist was waiting for viral load, fibrosis testing and genotype. He had abnormal heterogeneity on hepatic ultrasound with possible cirrhosis per clinic note. Patient had been  tested for TB which was negative, negative for hep B. He has had chickenpox vaccination, T data, influenza and HBV, MMR vaccination.  On the day of admission, patient stated that he has had abdominal pain for about one year. His abdominal pain is located in the left upper quadrant, constant, moderate. No nausea, vomiting, diarrhea. He also has bilaterally knee pain which has been going on for a long time. Patient has generalized weakness. He reported having nosebleeding and rectal bleeding sometimes. Patient does not have cough, chest pain, shortness breath, symptoms of UTI or unilateral weakness.  In ED, patient was found to have an elevated total bilirubin 2.1 on 04/07/15-->2.1, WBC 4.1 on 04/07/15--> 1.2, platelet 37-->21, negative urinalysis, lipase 24, INR 1.52, lactate 1.52, temperature normal, bradycardia, electrolytes and renal function okay. CT-abd/pelvis showed cirrhosis with splenomegaly and portal hypertension; gastric varices were present, mild amount of ascites in the right lower quadrant. gallbladder wall thickening may be due to portal hypertension versus cholecystitis. Patient admitted to inpatient for further evaluation and treatment.   Hospital Course:  #1 abdominal pain Likely secondary to splenomegaly noted on CT abdomen and pelvis. CT with cirrhosis with splenomegaly and portal hypertension with gastric varices being present. Patient stated that epigastric and left upper abdominal pain was improving. Patient's epigastric and left upper abdominal pain likely capsular pain secondary to splenomegaly and portal hypertension. Abdominal ultrasound with Doppler evaluation of the portal vein was unremarkable and negative for any thrombosis. Patient is a refugee with no insurance at this time. Patient was started on Neurontin and oxycodone was continued as well for pain control. Patient's pain improved and patient will follow-up  in the outpatient setting.  #2  neutropenia/thrombocytopenia Likely secondary to ongoing cirrhosis and splenomegaly with portal hypertension versus Gilbert's syndrome. Patient with no overt bleeding. Patient with severe neutropenia with WBC 1.3 and platelets of 20-30. Patient remained afebrile during the hospitalization and had no overt bleeding. Hematology consultation was obtained and patient was seen in consultation by Dr. Alen Blew who felt patient's thrombo-cytopenia and leukocytopenia was consistent with his degree of portal hypertension related to his liver disease. Patient's peripheral smear had no findings of hemolytic process or schistocytes. It was felt that a PNH, hairy cell leukemia or splenic lymphoma was extremely unlikely. It was felt per hematology that no further hematological investigation such as a bone marrow biopsy or further imaging was needed at this time. Patient will be discharged and will follow-up in the outpatient setting.  #3 bilateral knee pain Unclear etiology. Patient with no effusions noted on examination. Plain films of knees with trace effusion. Patient states pain with walking uphill and downhill. Pain management.   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 Patient with no current nasal bleeding or rectal bleeding. HIV is nonreactive. Hepatitis A antibody IgM is negative. Hepatitis B surface antigen is negative. Hepatitis B core antibody IgM is negative. Hepatitis C antibody 0.1. Patient was followed in [place] by mobile physicians and copies in the paper chart. Patient still with ongoing pain likely capsular pain from splenomegaly and portal hypertension. 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 Patient also with radiographic and clinical signs of cirrhosis. Abdominal ultrasound with Doppler evaluation of the portal vein was  unremarkable. Hepatitis C quant negative.  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 Was recommended to continue beta blocker to appropriately decrease portal hypertension and for gastric varices. It was felt per GI that no further workup was needed as an inpatient and patient wanted to follow-up with GI in the outpatient setting. Patient was also assessed by hematology who felt no further workup was needed at this time.   #5 intermittent rectal bleeding Likely secondary to thrombocytopenia. Patient with no bleeding during this hospitalization. Patient's hemoglobin remained stable. Patient was seen by GI during the hospitalization and will need to follow-up with GI as outpatient.    Procedures:  CT abdomen and pelvis 04/26/2015  Abdominal ultrasound with Doppler evaluation of the portal vein 04/29/2015  Consultations:  Gastroenterology: Dr Cristina Gong 04/28/2015  Hematology Dr Alen Blew 05/01/2015    Discharge Exam: Filed Vitals:   05/02/15 0611 05/02/15 1335  BP: 113/87 128/97  Pulse: 58 54  Temp: 97.7 F (36.5 C)   Resp: 20 18    General: NAD Cardiovascular: RRR Respiratory: CTAB  Discharge Instructions   Discharge Instructions    Diet general    Complete by:  As directed      Discharge instructions    Complete by:  As directed   [facility] as scheduled.     Increase activity slowly    Complete by:  As directed           Current Discharge Medication List    START taking these medications   Details  gabapentin (NEURONTIN) 600 MG tablet Take 0.5 tablets (300 mg total) by mouth 3 (three) times daily. Qty: 90 tablet, Refills: 0    oxyCODONE (OXY IR/ROXICODONE) 5 MG immediate release tablet Take  1 tablet (5 mg total) by mouth every 6 (six) hours as needed for moderate pain. Qty: 20 tablet, Refills: 0    pantoprazole (PROTONIX) 40 MG tablet Take 1 tablet (40 mg total) by mouth daily at 6 (six) AM. Qty: 30 tablet, Refills: 4     propranolol (INDERAL) 10 MG tablet Take 1 tablet (10 mg total) by mouth 3 (three) times daily. Qty: 90 tablet, Refills: 5      CONTINUE these medications which have NOT CHANGED   Details  naproxen (NAPROSYN) 500 MG tablet Take 500 mg by mouth 2 (two) times daily as needed for moderate pain.       No Known Allergies Follow-up Information    Follow up with [facility].   Why:  May 04, 2015 at 4:30 pm , please arrive to your appointment 15 minutes early, bring insurance card    Contact information:   [address] [place] [address] [phone]      Follow up with [facility].   Why:  will send referral to [facility].    Contact information:   [place] [address] [place] [address] [phone]        The results of significant diagnostics from this hospitalization (including imaging, microbiology, ancillary and laboratory) are listed below for reference.    Significant Diagnostic Studies: Dg Knee 1-2 Views Left  2015-05-01  CLINICAL DATA:  Bilateral knee pain, no known injury EXAM: LEFT KNEE - 1-2 VIEW COMPARISON:  None. FINDINGS: No fracture or dislocation is seen. The joint spaces are preserved. Visualized soft tissues are within normal limits. Suspected small suprapatellar knee joint effusion. IMPRESSION: No fracture or dislocation is seen. Suspected small suprapatellar knee joint effusion. Electronically Signed   By: Julian Hy M.D.   On: 05-01-15 19:07   Dg Knee 1-2 Views Right  05-01-2015  CLINICAL DATA:  Bilateral knee pain, no known injury. EXAM: RIGHT KNEE - 1-2 VIEW COMPARISON:  None. FINDINGS: There is no evidence of fracture or dislocation. There is no evidence of arthropathy or other focal bone abnormality. Trace effusion. Soft tissues are unremarkable. IMPRESSION: Trace effusion.  No bony abnormality. Electronically Signed   By: Jeb Levering M.D.   On: 05/01/2015 19:08   Ct Abdomen Pelvis W Contrast  04/26/2015  CLINICAL DATA:  Abdominal pain and pancytopenia.  Hepatitis-C EXAM: CT ABDOMEN AND PELVIS WITH CONTRAST TECHNIQUE: Multidetector CT imaging of the abdomen and pelvis was performed using the standard protocol following bolus administration of intravenous contrast. CONTRAST:  132m OMNIPAQUE IOHEXOL 300 MG/ML  SOLN COMPARISON:  None. FINDINGS: The machine malfunctioned midway through the scan. The abdomen pelvis were scanned separately approximately 1 minute apart from each other. Lung bases clear.  Cardiac enlargement. Changes of cirrhosis with hepatomegaly. Irregular liver capsule compatible with scarring and cirrhosis. Marked splenomegaly. Enlarged splenic vein. Enlarged portal vein. Gastric varices. Findings consistent with portal hypertension. Gallbladder wall thickening could be due to portal hypertension versus cholecystitis. No calcified gallstone. No biliary duct dilatation. Kidneys show no renal mass or obstruction. Normal pancreas without evidence of pancreatitis or calcification or mass. Negative for bowel obstruction.  No bowel mass or edema. Small amount of ascites in the right lower quadrant. No pelvic ascites. Urinary bladder normal.  No adenopathy. No acute skeletal abnormality. IMPRESSION: Cirrhosis with splenomegaly and portal hypertension. 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 Mild amount of ascites in the right lower quadrant. Gallbladder  wall thickening may be due to portal hypertension versus cholecystitis. Correlate with pain in this area. Electronically Signed   By: Franchot Gallo M.D.   On: 04/26/2015 22:52   [place] Art/ven Flow Abd Pelv Doppler  04/29/2015  CLINICAL DATA:  Splenomegaly. EXAM: DUPLEX ULTRASOUND OF LIVER TECHNIQUE: Color and duplex Doppler ultrasound was performed to evaluate the hepatic in-flow and out-flow vessels. COMPARISON:  CT scan of April 26, 2015. FINDINGS: Portal Vein Velocities Main:  43.7 cm/sec  Right:  25.1 cm/sec Left:  21.6 cm/sec Normal hepatopetal flow is noted. Hepatic Vein Velocities Right:  14.2 cm/sec Middle:  38.1 cm/sec Left:  39.0 cm/sec Normal hepatofugal flow is noted. Hepatic Artery Velocity:  97.4 cm/sec Splenic Vein Velocity:  22.3 cm/sec Varices: Absent. Ascites: Minimal ascites is noted. No evidence of portal, hepatic or splenic venous thrombosis or occlusion is noted. Electronically Signed   By: Marijo Conception, M.D.   On: 04/29/2015 10:35    Microbiology: No results found for this or any previous visit (from the past 240 hour(s)).   Labs: Basic Metabolic Panel:  Recent Labs Lab 04/28/15 0447 04/29/15 0425 04/30/15 0422 05/01/15 0353 05/02/15 0602  NA 139 140 139 136 139  K 3.7 3.8 3.6 3.8 3.7  CL 107 105 104 105 102  CO2 25 26 26 24 27   GLUCOSE 85 92 91 91 83  BUN 7 6 8 9 10   CREATININE 0.97 1.03 0.88 0.89 0.78  CALCIUM 8.4* 8.5* 8.5* 8.5* 9.1  MG 1.8  --   --   --   --    Liver Function Tests:  Recent Labs Lab 04/27/15 0031 04/28/15 0447 04/29/15 0425 05/01/15 0353 05/02/15 0602  AST 16 12* 15 15 16   ALT 20 18 18 18 22   ALKPHOS 37* 41 42 42 41  BILITOT 2.0* 2.3* 1.8* 1.7* 2.0*  PROT 6.4* 6.5 6.5 6.7 7.0  ALBUMIN 3.4* 3.3* 3.3* 3.4* 3.6    Recent Labs Lab 04/26/15 1932  LIPASE 24   No results for input(s): AMMONIA in the last 168 hours. CBC:  Recent Labs Lab 04/27/15 1351 04/28/15 0447 04/29/15 0425 04/30/15 0422 05/01/15 0353 05/02/15 0602  WBC  --  1.3* 1.3* 1.3* 1.3* 1.3*  NEUTROABS 0.6* 0.7* 0.6*  --   --  0.6*  HGB  --  12.7* 12.7* 12.5* 12.3* 13.1  HCT  --  38.8* 38.7* 39.5 37.6* 40.8  MCV  --  85.7 85.2 84.6 83.7 84.3  PLT  --  22* 24* 22* 30* 24*   Cardiac Enzymes: No results for input(s): CKTOTAL, CKMB, CKMBINDEX, TROPONINI in the last 168 hours. BNP: BNP (last 3 results) No results for input(s): BNP in the last 8760 hours.  ProBNP (last 3 results) No results for input(s): PROBNP in the last 8760  hours.  CBG:  Recent Labs Lab 04/27/15 1229 04/28/15 0614 04/28/15 0652 04/29/15 0618 04/30/15 0626  GLUCAP 104* 69 103* 75 108*       Signed:  Arieal Cuoco MD.  [facility] 05/02/2015, 3:58 PM

## 2015-05-02 NOTE — Progress Notes (Signed)
CSW contacted for taxi voucher to home, pt states he does not know how to ride the bus and not familiar with the city. Antonio DonningAlesia Mailin Coglianese RN CCM Case Mgmt phone [phone]

## 2015-05-02 NOTE — Progress Notes (Signed)
Interpretor used for assessment and medications.  Patient is complaining of pain in his knees, especially when he bends them.  Pain medicine given.  Will continue to monitor.

## 2015-05-02 NOTE — Telephone Encounter (Signed)
Message received from Isidoro DonningAlesia Shavis, RN CM requesting a hospital follow up appointment for the patient.  An appointment was scheduled for 05/04/15 @ 1630 and the information was place on the AVS.   Update provided to A. Mariea StableShavis, RN CM noting that the appointment would be scheduled.

## 2015-05-02 NOTE — Progress Notes (Signed)
CRITICAL VALUE ALERT  Critical value received:  Platelets 24  Date of notification:  05/02/15  Time of notification:  1038  Critical value read back:Yes.    Nurse who received alert:  Courtney HeysLauren Mueller, RN  MD notified (1st page):  Dr. Janee Mornhompson  Time of first page:  1040  MD notified (2nd page):  Time of second page:  Responding MD:    Time MD responded:

## 2015-05-03 MED FILL — GABAPENTIN 600 MG TABLET: 600 | 30 days supply | Qty: 45 | Fill #0

## 2015-05-03 MED FILL — oxyCODONE HCL 5 MG TABS: 5 | 5 days supply | Qty: 20 | Fill #0

## 2015-05-04 ENCOUNTER — Ambulatory Visit: Payer: Medicaid - Out of State | Attending: Internal Medicine | Admitting: Internal Medicine

## 2015-05-04 ENCOUNTER — Encounter: Payer: Self-pay | Admitting: Internal Medicine

## 2015-05-04 VITALS — BP 119/79 | HR 63 | Temp 97.3°F | Resp 18 | Ht 66.0 in | Wt 157.6 lb

## 2015-05-04 DIAGNOSIS — R161 Splenomegaly, not elsewhere classified: Secondary | ICD-10-CM | POA: Diagnosis not present

## 2015-05-04 DIAGNOSIS — D696 Thrombocytopenia, unspecified: Secondary | ICD-10-CM | POA: Insufficient documentation

## 2015-05-04 DIAGNOSIS — M7042 Prepatellar bursitis, left knee: Secondary | ICD-10-CM

## 2015-05-04 DIAGNOSIS — Z09 Encounter for follow-up examination after completed treatment for conditions other than malignant neoplasm: Secondary | ICD-10-CM | POA: Insufficient documentation

## 2015-05-04 DIAGNOSIS — D61818 Other pancytopenia: Secondary | ICD-10-CM | POA: Diagnosis not present

## 2015-05-04 DIAGNOSIS — M25561 Pain in right knee: Secondary | ICD-10-CM | POA: Diagnosis not present

## 2015-05-04 DIAGNOSIS — M25562 Pain in left knee: Secondary | ICD-10-CM | POA: Diagnosis not present

## 2015-05-04 DIAGNOSIS — K766 Portal hypertension: Secondary | ICD-10-CM | POA: Diagnosis not present

## 2015-05-04 DIAGNOSIS — K7460 Unspecified cirrhosis of liver: Secondary | ICD-10-CM

## 2015-05-04 MED ORDER — NAPROXEN 500 MG PO TABS: 500.0000 mg | ORAL_TABLET | Freq: Two times a day (BID) | ORAL | Status: DC | PRN

## 2015-05-04 NOTE — Patient Instructions (Addendum)
It was good to see you today.  We have reviewed your prior records including labs and tests today  Ok to try naprosyn twice daily WITH food for knee pain Continue gabapentin (1/2 pill) three time daily also for pain No other changes recommended at this time.  Your prescription(s) have been submitted to this pharmacy at [facility] (and given as paper prescription for you to take with you). Please take as directed and contact our office if you believe you are having problem(s) with the medication(s).  we'll make referral to Gastroenterology for further treatment of your cirrhosis. Our office will contact you regarding appointment(s) once made.  Please schedule followup in 4-6 weeks to review knee pain symptoms, call sooner if problems.  Prepatellar Bursitis With Rehab  Bursitis is a condition that is characterized by inflammation of a bursa. Kateri Mc exists in many areas of the body. They are fluid-filled sacs that lie between a soft tissue (skin, tendon, or ligament) and a bone, and they reduce friction between the structures as well as the stress placed on the soft tissue. Prepatellar bursitis is inflammation of the bursa that lies between the skin and the kneecap (patella). This condition often causes pain over the patella. SYMPTOMS   Pain, tenderness, and/or inflammation over the patella.  Pain that worsens with movement of the knee joint.  Decreased range of motion for the knee joint.  A crackling sound (crepitation) when the bursa is moved or touched.  Occasionally, painless swelling of the bursa.  Fever (when infected). CAUSES  Bursitis is caused by damage to the bursa, which results in an inflammatory response. Common mechanisms of injury include:  Direct trauma to the front of the knee.  Repetitive and/or stressful use of the knee. RISK INCREASES WITH:  Activities in which kneeling and/or falling on one's knees is likely (volleyball or  football).  Repetitive and stressful training, especially if it involves running on hills.  Improper training techniques, such as a sudden increase in the intensity, frequency, or duration of training.  Failure to warm up properly before activity.  Poor technique.  Artificial turf. PREVENTION   Avoid kneeling or falling on your knees.  Warm up and stretch properly before activity.  Allow for adequate recovery between workouts.  Maintain physical fitness:  Strength, flexibility, and endurance.  Cardiovascular fitness.  Learn and use proper technique. When possible, have a coach correct improper technique.  Wear properly fitted and padded protective equipment (knee pads). PROGNOSIS  If treated properly, then the symptoms of prepatellar bursitis usually resolve within 2 weeks. RELATED COMPLICATIONS   Recurrent symptoms that result in a chronic problem.  Prolonged healing time, if improperly treated or reinjured.  Limited range of motion.  Infection of bursa.  Chronic inflammation or scarring of bursa. TREATMENT  Treatment initially involves the use of ice and medication to help reduce pain and inflammation. The use of strengthening and stretching exercises may help reduce pain with activity, especially those of the quadriceps and hamstring muscles. These exercises may be performed at home or with referral to a therapist. Your caregiver may recommend knee pads when you return to playing sports, in order to reduce the stress on the prepatellar bursa. If symptoms persist despite treatment, then your caregiver may drain fluid out with a needle (aspirate) the bursa. If symptoms persist for greater than 6 months despite nonsurgical (conservative) treatment, then surgery may be recommended to remove the bursa.  MEDICATION  If pain medication is necessary, then nonsteroidal  anti-inflammatory medications, such as aspirin and ibuprofen, or other minor pain relievers, such as  acetaminophen, are often recommended.  Do not take pain medication for 7 days before surgery.  Prescription pain relievers may be given if deemed necessary by your caregiver. Use only as directed and only as much as you need.  Corticosteroid injections may be given by your caregiver. These injections should be reserved for the most serious cases, because they may only be given a certain number of times. HEAT AND COLD  Cold treatment (icing) relieves pain and reduces inflammation. Cold treatment should be applied for 10 to 15 minutes every 2 to 3 hours for inflammation and pain and immediately after any activity that aggravates your symptoms. Use ice packs or massage the area with a piece of ice (ice massage).  Heat treatment may be used prior to performing the stretching and strengthening activities prescribed by your caregiver, physical therapist, or athletic trainer. Use a heat pack or soak the injury in warm water. SEEK MEDICAL CARE IF:  Treatment seems to offer no benefit, or the condition worsens.  Any medications produce adverse side effects. EXERCISES RANGE OF MOTION (ROM) AND STRETCHING EXERCISES - Prepatellar Bursitis These exercises may help you when beginning to rehabilitate your injury. Your symptoms may resolve with or without further involvement from your physician, physical therapist or athletic trainer. While completing these exercises, remember:   Restoring tissue flexibility helps normal motion to return to the joints. This allows healthier, less painful movement and activity.  An effective stretch should be held for at least 30 seconds.  A stretch should never be painful. You should only feel a gentle lengthening or release in the stretched tissue. STRETCH - Hamstrings, Standing  Stand or sit and extend your right / left leg, placing your foot on a chair or foot stool  Keeping a slight arch in your low back and your hips straight forward.  Lead with your chest and  lean forward at the waist until you feel a gentle stretch in the back of your right / left knee or thigh. (When done correctly, this exercise requires leaning only a small distance.)  Hold this position for __________ seconds. Repeat __________ times. Complete this stretch __________ times per day. STRETCH - Quadriceps, Prone   Lie on your stomach on a firm surface, such as a bed or padded floor.  Bend your right / left knee and grasp your ankle. If you are unable to reach, your ankle or pant leg, use a belt around your foot to lengthen your reach.  Gently pull your heel toward your buttocks. Your knee should not slide out to the side. You should feel a stretch in the front of your thigh and/or knee.  Hold this position for __________ seconds. Repeat __________ times. Complete this stretch __________ times per day.  STRETCH - Hamstrings/Adductors, V-Sit   Sit on the floor with your legs extended in a large "V," keeping your knees straight.  With your head and chest upright, bend at your waist reaching for your right foot to stretch your left adductors.  You should feel a stretch in your left inner thigh. Hold for __________ seconds.  Return to the upright position to relax your leg muscles.  Continuing to keep your chest upright, bend straight forward at your waist to stretch your hamstrings.  You should feel a stretch behind both of your thighs and/or knees. Hold for __________ seconds.  Return to the upright position to relax your leg  muscles.  Repeat steps 2 through 4. Repeat __________ times. Complete this exercise __________ times per day.  STRENGTHENING EXERCISES - Prepatellar Bursitis  These exercises may help you when beginning to rehabilitate your injury. They may resolve your symptoms with or without further involvement from your physician, physical therapist or athletic trainer. While completing these exercises, remember:  Muscles can gain both the endurance and the  strength needed for everyday activities through controlled exercises.  Complete these exercises as instructed by your physician, physical therapist or athletic trainer. Progress the resistance and repetitions only as guided. STRENGTH - Quadriceps, Isometrics  Lie on your back with your right / left leg extended and your opposite knee bent.  Gradually tense the muscles in the front of your right / left thigh. You should see either your kneecap slide up toward your hip or increased dimpling just above the knee. This motion will push the back of the knee down toward the floor/mat/bed on which you are lying.  Hold the muscle as tight as you can without increasing your pain for __________ seconds.  Relax the muscles slowly and completely in between each repetition. Repeat __________ times. Complete this exercise __________ times per day.  STRENGTH - Quadriceps, Short Arcs   Lie on your back. Place a __________ inch towel roll under your knee so that the knee slightly bends.  Raise only your lower leg by tightening the muscles in the front of your thigh. Do not allow your thigh to rise.  Hold this position for __________ seconds. Repeat __________ times. Complete this exercise __________ times per day.  OPTIONAL ANKLE WEIGHTS: Begin with ____________________, but DO NOT exceed ____________________. Increase in1 lb/0.5 kg increments.  STRENGTH - Quadriceps, Straight Leg Raises  Quality counts! Watch for signs that the quadriceps muscle is working to insure you are strengthening the correct muscles and not "cheating" by substituting with healthier muscles.  Lay on your back with your right / left leg extended and your opposite knee bent.  Tense the muscles in the front of your right / left thigh. You should see either your kneecap slide up or increased dimpling just above the knee. Your thigh may even quiver.  Tighten these muscles even more and raise your leg 4 to 6 inches off the floor. Hold  for __________ seconds.  Keeping these muscles tense, lower your leg.  Relax the muscles slowly and completely in between each repetition. Repeat __________ times. Complete this exercise __________ times per day.  STRENGTH - Quadriceps, Step-Ups   Use a thick book, step or step stool that is __________ inches tall.  Holding a wall or counter for balance only, not support.  Slowly step-up with your right / left foot, keeping your knee in line with your hip and foot. Do not allow your knee to bend so far that you cannot see your toes.  Slowly unlock your knee and lower yourself to the starting position. Your muscles, not gravity, should lower you. Repeat __________ times. Complete this exercise __________ times per day.   This information is not intended to replace advice given to you by your health care provider. Make sure you discuss any questions you have with your health care provider.   Document Released: 02/05/2005 Document Revised: 10/27/2014 Document Reviewed: 05/20/2008 Elsevier Interactive Patient Education [employer]

## 2015-05-04 NOTE — Progress Notes (Signed)
Subjective:    Patient ID: Antonio Armstrong, male    DOB: [birth year], 40 y.o.   MRN: [id number]  HPI  Patient here for hospital follow up -  Today complains of bilateral knee pain, not while standing or walking on level ground, but while walking/climbing on incline, esp down stairs Requests "shots" for treatment of knee pain as previously helpful  Past Medical History  Diagnosis Date  . Idiopathic cirrhosis (HCC)     HCV RNA not detected 04/29/15; Hep A, Hep B and HIV NR 04/2015  . Thrombocytopenia (HCC)   . Pancytopenia (HCC)   . Splenomegaly   . Portal hypertension (HCC)     Review of Systems  Constitutional: Negative for fever, fatigue and unexpected weight change.  Respiratory: Negative for cough and shortness of breath.   Cardiovascular: Negative for chest pain and leg swelling.  Gastrointestinal: Negative for nausea, vomiting, abdominal pain, blood in stool and abdominal distention.  Musculoskeletal: Positive for joint swelling (L knee 3 weeks ago, none now) and arthralgias (L>R knee x 26mo). Negative for myalgias and gait problem.  Neurological: Negative for dizziness and weakness.       Objective:    Physical Exam  Constitutional: He appears well-developed and well-nourished. No distress.  Cardiovascular: Normal rate, regular rhythm and normal heart sounds.  Exam reveals no friction rub.   No murmur heard. Pulmonary/Chest: Effort normal and breath sounds normal. No respiratory distress.  Abdominal: Soft. Bowel sounds are normal. There is no tenderness. There is no guarding.  Musculoskeletal:  B knee - +crepitation; no evidence of synovitis - tender to palpation over patella edge with motion; FROM of knee and ligamentous function intact   Skin: Skin is warm and dry.  Psychiatric: He has a normal mood and affect. His behavior is normal. Judgment and thought content normal.    BP 119/79 mmHg  Pulse 63  Temp(Src) 97.3 F (36.3 C) (Oral)  Resp 18  Ht  (1.676  m)  Wt 157 lb 9.6 oz (71.487 kg)  BMI 25.45 kg/m2  SpO2 98% Wt Readings from Last 3 Encounters:  05/04/15 157 lb 9.6 oz (71.487 kg)  05/02/15 155 lb 4.8 oz (70.444 kg)     Lab Results  Component Value Date   WBC 1.3* 05/02/2015   HGB 13.1 05/02/2015   HCT 40.8 05/02/2015   PLT 24* 05/02/2015   GLUCOSE 83 05/02/2015   ALT 22 05/02/2015   AST 16 05/02/2015   NA 139 05/02/2015   K 3.7 05/02/2015   CL 102 05/02/2015   CREATININE 0.78 05/02/2015   BUN 10 05/02/2015   CO2 27 05/02/2015   INR 1.52* 04/26/2015    Ct Abdomen Pelvis W Contrast  04/26/2015  CLINICAL DATA:  Abdominal pain and pancytopenia.  Hepatitis-C EXAM: CT ABDOMEN AND PELVIS WITH CONTRAST TECHNIQUE: Multidetector CT imaging of the abdomen and pelvis was performed using the standard protocol following bolus administration of intravenous contrast. CONTRAST:  OMNIPAQUE IOHEXOL 300 MG/ML  SOLN COMPARISON:  None. FINDINGS: The machine malfunctioned midway through the scan. The abdomen pelvis were scanned separately approximately 1 minute apart from each other. Lung bases clear.  Cardiac enlargement. Changes of cirrhosis with hepatomegaly. Irregular liver capsule compatible with scarring and cirrhosis. Marked splenomegaly. Enlarged splenic vein. Enlarged portal vein. Gastric varices. Findings consistent with portal hypertension. Gallbladder wall thickening could be due to portal hypertension versus cholecystitis. No calcified gallstone. No biliary duct dilatation. Kidneys show no renal mass or obstruction. Normal pancreas without  evidence of pancreatitis or calcification or mass. Negative for bowel obstruction.  No bowel mass or edema. Small amount of ascites in the right lower quadrant. No pelvic ascites. Urinary bladder normal.  No adenopathy. No acute skeletal abnormality. IMPRESSION: Cirrhosis with splenomegaly and portal hypertension. 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 Mild amount of ascites in the right lower quadrant.  Gallbladder wall thickening may be due to portal hypertension versus cholecystitis. Correlate with pain in this area. Electronically Signed   By: Marlan Palauharles  Clark M.D.   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 related to cirrhosis  recommended propanolol and PPI, but pt primary concern today is only in regards to L>R pre patellar knee pain - see next No evidence for ascites or decompensation on exam today      Relevant Orders   Ambulatory referral to Gastroenterology   Prepatellar bursitis, left    Patient indicates classic symptoms of prepatellar bursitis and relates prior swelling of L knee around patellar bursa (which is not present today) Pt given gaba rx but has not yet started same Pt indicates prior relief with injections and naprosyn for same Unable to provide cortisone injection at this clinic, but will cautiously provide limited rx of NSAIDs for relief of his primary concern today (concern with co-morbid cirrhosis noted and reviewed) May need future refer to sports med/ortho clinic to consider injection if NSAIDs do not help pain Also instructed on symptoms relief and exercises for management of this condition      Relevant Medications   naproxen (NAPROSYN) 500 MG tablet       Rene PaciValerie Leschber, MD

## 2015-05-04 NOTE — Progress Notes (Signed)
Patient is here for ED FU  Patient complains of bilateral knee pain. Pain increases when knees are bent. Walking decreases the pain. Pain is scaled currently at a 5.

## 2015-05-04 NOTE — Assessment & Plan Note (Signed)
Patient indicates classic symptoms of prepatellar bursitis and relates prior swelling of L knee around patellar bursa (which is not present today) Pt given gaba rx but has not yet started same Pt indicates prior relief with injections and naprosyn for same Unable to provide cortisone injection at this clinic, but will cautiously provide limited rx of NSAIDs for relief of his primary concern today (concern with co-morbid cirrhosis noted and reviewed) May need future refer to sports med/ortho clinic to consider injection if NSAIDs do not help pain Also instructed on symptoms relief and exercises for management of this condition

## 2015-05-04 NOTE — Assessment & Plan Note (Signed)
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

## 2015-06-15 ENCOUNTER — Encounter: Payer: Self-pay | Admitting: Family Medicine

## 2015-06-15 ENCOUNTER — Other Ambulatory Visit: Payer: Self-pay | Admitting: Family Medicine

## 2015-06-15 ENCOUNTER — Ambulatory Visit: Payer: Medicaid - Out of State | Attending: Family Medicine | Admitting: Family Medicine

## 2015-06-15 VITALS — BP 96/62 | HR 77 | Temp 97.4°F | Resp 14 | Ht 66.0 in | Wt 155.0 lb

## 2015-06-15 DIAGNOSIS — K7460 Unspecified cirrhosis of liver: Secondary | ICD-10-CM

## 2015-06-15 DIAGNOSIS — Z79899 Other long term (current) drug therapy: Secondary | ICD-10-CM | POA: Insufficient documentation

## 2015-06-15 DIAGNOSIS — M25562 Pain in left knee: Secondary | ICD-10-CM | POA: Insufficient documentation

## 2015-06-15 DIAGNOSIS — K766 Portal hypertension: Secondary | ICD-10-CM | POA: Diagnosis not present

## 2015-06-15 DIAGNOSIS — R161 Splenomegaly, not elsewhere classified: Secondary | ICD-10-CM | POA: Insufficient documentation

## 2015-06-15 DIAGNOSIS — I864 Gastric varices: Secondary | ICD-10-CM | POA: Diagnosis not present

## 2015-06-15 DIAGNOSIS — I8510 Secondary esophageal varices without bleeding: Secondary | ICD-10-CM | POA: Insufficient documentation

## 2015-06-15 DIAGNOSIS — D696 Thrombocytopenia, unspecified: Secondary | ICD-10-CM | POA: Insufficient documentation

## 2015-06-15 DIAGNOSIS — K7469 Other cirrhosis of liver: Secondary | ICD-10-CM | POA: Insufficient documentation

## 2015-06-15 MED ORDER — GABAPENTIN 600 MG PO TABS: 300.0000 mg | ORAL_TABLET | Freq: Three times a day (TID) | ORAL | Status: DC

## 2015-06-15 MED ORDER — NAPROXEN 500 MG PO TABS: 500.0000 mg | ORAL_TABLET | Freq: Two times a day (BID) | ORAL | Status: DC | PRN

## 2015-06-15 MED ORDER — OMEPRAZOLE 20 MG PO CPDR: 20.0000 mg | DELAYED_RELEASE_CAPSULE | Freq: Every day | ORAL | Status: DC

## 2015-06-15 MED FILL — NAPROXEN 500 MG TABLET: 500 | 30 days supply | Qty: 60 | Fill #0

## 2015-06-15 MED FILL — ?OMEPRAZOLE DR 20 MG CAPSUL: 20 | 30 days supply | Qty: 30 | Fill #0

## 2015-06-15 MED FILL — GABAPENTIN 300 MG CAPSULE: 300 | 30 days supply | Qty: 90 | Fill #0

## 2015-06-15 NOTE — Progress Notes (Signed)
Subjective:  Patient ID: Gaspar Fowle, male    DOB: 05-15-1975  Age: 40 y.o. MRN: [id number]  CC: Establish Care and Medication Refill   HPI Konor Noren is a 40 year old refugee male with a history of liver cirrhosis with portal hypertension and gastric varices, splenomegaly, thrombocytopenia with recent hospitalization at [facility] from 04/26/15-05/02/15. He had abdominal pain which was thought to be secondary to capsular pain from splenomegaly and portal hypertension. He was seen in conjunction with GI and hematology and outpatient follow-up with GI was recommended; no further work up for thrombocytopenia as per Hematology.  Today he denies abdominal pain but complains mostly of right knee pain; knee x-ray from hospitalization revealed mild suprapatellar effusion. Pain is 5/10 on exertion and worse with change in position but absent at rest. He denies edema of the knee; he has run out of the Naproxen and Gabapentin he was taking.  He denies any epistaxis (he did have some in 01/2015), denies easy bruising and is not under the care of a gastroenterologist. While in [place] before he relocated here he was under the care of [facility] gastroenterology.  He is accompanied today by his Child psychotherapist and a Tax adviser.      Outpatient Prescriptions Prior to Visit  Medication Sig Dispense Refill  . gabapentin (NEURONTIN) 600 MG tablet Take 0.5 tablets (300 mg total) by mouth 3 (three) times daily. 90 tablet 0  . naproxen (NAPROSYN) 500 MG tablet Take 1 tablet (500 mg total) by mouth 2 (two) times daily as needed for moderate pain (take w/ food). 60 tablet 0   No facility-administered medications prior to visit.    ROS Review of Systems  Constitutional: Negative for activity change and appetite change.  HENT: Negative for sinus pressure and sore throat.   Eyes: Negative for visual disturbance.  Respiratory: Negative for cough, chest tightness and shortness of breath.     Cardiovascular: Negative for chest pain and leg swelling.  Gastrointestinal: Negative for abdominal pain, diarrhea, constipation and abdominal distention.  Endocrine: Negative.   Genitourinary: Negative for dysuria.  Musculoskeletal:       See HPI  Skin: Negative for rash.  Allergic/Immunologic: Negative.   Neurological: Negative for weakness, light-headedness and numbness.  Psychiatric/Behavioral: Negative for suicidal ideas and dysphoric mood.    Objective:  BP 96/62 mmHg  Pulse 77  Temp(Src) 97.4 F (36.3 C) (Oral)  Resp 14  Ht  (1.676 m)  Wt 155 lb (70.308 kg)  BMI 25.03 kg/m2  SpO2 98%  BP/Weight 06/15/2015 05/04/2015 05/02/2015  Systolic BP 96 119 128  Diastolic BP 62 79 97  Wt. (Lbs) 155 157.6 155.3  BMI 25.03 25.45 -      Physical Exam  Constitutional: He is oriented to person, place, and time. He appears well-developed and well-nourished.  Cardiovascular: Normal rate, normal heart sounds and intact distal pulses.   No murmur heard. Pulmonary/Chest: Effort normal and breath sounds normal. He has no wheezes. He has no rales. He exhibits no tenderness.  Abdominal: Soft. Bowel sounds are normal. He exhibits mass (splenomegaly). He exhibits no distension. There is no tenderness.  Musculoskeletal: Normal range of motion. He exhibits tenderness (No tenderness on palpation of right knee but on flexion and extension of right knee). He exhibits no edema.  Neurological: He is alert and oriented to person, place, and time.   CMP Latest Ref Rng 05/02/2015 05/01/2015 04/30/2015  Glucose 65 - 99 mg/dL 83 91 91  BUN 6 -  20 mg/dL 10 9 8   Creatinine 0.61 - 1.24 mg/dL 8.650.78 7.840.89 6.960.88  Sodium 135 - 145 mmol/L 139 136 139  Potassium 3.5 - 5.1 mmol/L 3.7 3.8 3.6  Chloride 101 - 111 mmol/L 102 105 104  CO2 22 - 32 mmol/L 27 24 26   Calcium 8.9 - 10.3 mg/dL 9.1 2.9(B8.5(L) 2.8(U8.5(L)  Total Protein 6.5 - 8.1 g/dL 7.0 6.7 -  Total Bilirubin 0.3 - 1.2 mg/dL 2.0(H) 1.7(H) -  Alkaline Phos 38  - 126 U/L 41 42 -  AST 15 - 41 U/L 16 15 -  ALT 17 - 63 U/L 22 18 -     CBC    Component Value Date/Time   WBC 1.3* 05/02/2015 0602   RBC 4.84 05/02/2015 0602   RBC 4.41 04/27/2015 0031   HGB 13.1 05/02/2015 0602   HCT 40.8 05/02/2015 0602   PLT 24* 05/02/2015 0602   MCV 84.3 05/02/2015 0602   MCH 27.1 05/02/2015 0602   MCHC 32.1 05/02/2015 0602   RDW 14.7 05/02/2015 0602   LYMPHSABS 0.5* 05/02/2015 0602   MONOABS 0.1 05/02/2015 0602   EOSABS 0.1 05/02/2015 0602   BASOSABS 0.0 05/02/2015 0602    CLINICAL DATA: Bilateral knee pain, no known injury  EXAM: LEFT KNEE - 1-2 VIEW  COMPARISON: None.  FINDINGS: No fracture or dislocation is seen.  The joint spaces are preserved.  Visualized soft tissues are within normal limits.  Suspected small suprapatellar knee joint effusion.  IMPRESSION: No fracture or dislocation is seen.  Suspected small suprapatellar knee joint effusion.   Electronically Signed  By: Charline BillsSriyesh Krishnan M.D.  On: 04/29/2015 19:07      CLINICAL DATA: Abdominal pain and pancytopenia. Hepatitis-C  EXAM: CT ABDOMEN AND PELVIS WITH CONTRAST  TECHNIQUE: Multidetector CT imaging of the abdomen and pelvis was performed using the standard protocol following bolus administration of intravenous contrast.  CONTRAST: 100mL OMNIPAQUE IOHEXOL 300 MG/ML SOLN  COMPARISON: None.  FINDINGS: The machine malfunctioned midway through the scan. The abdomen pelvis were scanned separately approximately 1 minute apart from each other.  Lung bases clear. Cardiac enlargement.  Changes of cirrhosis with hepatomegaly. Irregular liver capsule compatible with scarring and cirrhosis. Marked splenomegaly. Enlarged splenic vein. Enlarged portal vein. Gastric varices. Findings consistent with portal hypertension. Gallbladder wall thickening could be due to portal hypertension versus cholecystitis. No calcified gallstone. No biliary  duct dilatation.  Kidneys show no renal mass or obstruction.  Normal pancreas without evidence of pancreatitis or calcification or mass.  Negative for bowel obstruction. No bowel mass or edema.  Small amount of ascites in the right lower quadrant. No pelvic ascites.  Urinary bladder normal. No adenopathy.  No acute skeletal abnormality.  IMPRESSION: Cirrhosis with splenomegaly and portal hypertension. 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 Mild amount of ascites in the right lower quadrant.  Gallbladder wall thickening may be due to portal hypertension versus cholecystitis. Correlate with pain in this area.   Electronically Signed  By: Marlan Palauharles Clark M.D.  On: 04/26/2015 22:52    Assessment & Plan:   1. Idiopathic cirrhosis (HCC) Blood pressure is on the soft side and so I will hold off on placing him on a beta blocker for portal hypertension Placed on PPI due to esophageal Varices He will need referral to GI for optimization of management Advised to work on obtaining the [facility] discount to facilitate this process  2. Splenomegaly No current abdominal pain at this time  3. Left knee pain I have explained to him  that he will not be a candidate for cortisone injections due to the fact that she has severe thrombocytopenia - naproxen (NAPROSYN) 500 MG tablet; Take 1 tablet (500 mg total) by mouth 2 (two) times daily as needed for moderate pain (take w/ food).  Dispense: 60 tablet; Refill: 0 - gabapentin (NEURONTIN) 600 MG tablet; Take 0.5 tablets (300 mg total) by mouth 3 (three) times daily.  Dispense: 90 tablet; Refill: 3  4. Thrombocytopenia (HCC) No evidence of bleed - CBC with Differential/Platelet   Meds ordered this encounter  Medications  . omeprazole (PRILOSEC) 20 MG capsule    Sig: Take 1 capsule (20 mg total) by mouth daily.    Dispense:  30 capsule    Refill:  3  . naproxen (NAPROSYN) 500 MG tablet    Sig: Take 1 tablet (500 mg total) by  mouth 2 (two) times daily as needed for moderate pain (take w/ food).    Dispense:  60 tablet    Refill:  0  . gabapentin (NEURONTIN) 600 MG tablet    Sig: Take 0.5 tablets (300 mg total) by mouth 3 (three) times daily.    Dispense:  90 tablet    Refill:  3    Follow-up: Return in about 3 weeks (around 07/06/2015) for Follow-up on left knee pain.   Jaclyn Shaggy MD

## 2015-06-15 NOTE — Progress Notes (Signed)
Patient's here to est care with PCP and lab work.  Patient states he's taking other meds including for his knee, but doesn't know the name of the mediation.   Patient requesting refill on medication.

## 2015-06-16 LAB — CBC WITH DIFFERENTIAL/PLATELET
BASOS ABS: 13 {cells}/uL (ref 0–200)
Basophils Relative: 1 %
EOS ABS: 26 {cells}/uL (ref 15–500)
Eosinophils Relative: 2 %
HCT: 40.3 % (ref 38.5–50.0)
Hemoglobin: 12.8 g/dL — ABNORMAL LOW (ref 13.2–17.1)
LYMPHS PCT: 32 %
Lymphs Abs: 416 cells/uL — ABNORMAL LOW (ref 850–3900)
MCH: 27.6 pg (ref 27.0–33.0)
MCHC: 31.8 g/dL — ABNORMAL LOW (ref 32.0–36.0)
MCV: 86.9 fL (ref 80.0–100.0)
MONOS PCT: 12 %
Monocytes Absolute: 156 cells/uL — ABNORMAL LOW (ref 200–950)
Neutro Abs: 689 cells/uL — ABNORMAL LOW (ref 1500–7800)
Neutrophils Relative %: 53 %
PLATELETS: 29 10*3/uL — AB (ref 140–400)
RBC: 4.64 MIL/uL (ref 4.20–5.80)
RDW: 16.3 % — AB (ref 11.0–15.0)
WBC: 1.3 10*3/uL — ABNORMAL LOW (ref 3.8–10.8)

## 2015-06-16 LAB — PATHOLOGIST SMEAR REVIEW

## 2015-06-17 ENCOUNTER — Telehealth: Payer: Self-pay

## 2015-06-17 NOTE — Telephone Encounter (Signed)
[facility] [place] [phone]. Interpreter verified name and DOB. Patient was given lab results verbalize understanding with no further questions.

## 2015-06-17 NOTE — Telephone Encounter (Signed)
-----   Message from Jaclyn ShaggyEnobong Amao, MD sent at 06/17/2015  7:25 AM EDT ----- His white blood count and platelet are low which puts him at risk for bleeding. He was seen by Hematology (blood specialist ) who felt this was due to his ongoing conditions with no new recommendations.

## 2015-07-06 ENCOUNTER — Ambulatory Visit: Payer: Medicaid - Out of State | Attending: Family Medicine | Admitting: Family Medicine

## 2015-07-06 ENCOUNTER — Encounter: Payer: Self-pay | Admitting: Family Medicine

## 2015-07-06 VITALS — BP 123/73 | HR 62 | Temp 98.5°F | Resp 18 | Ht 66.0 in | Wt 155.4 lb

## 2015-07-06 DIAGNOSIS — M25561 Pain in right knee: Secondary | ICD-10-CM | POA: Insufficient documentation

## 2015-07-06 DIAGNOSIS — D696 Thrombocytopenia, unspecified: Secondary | ICD-10-CM | POA: Insufficient documentation

## 2015-07-06 DIAGNOSIS — K7460 Unspecified cirrhosis of liver: Secondary | ICD-10-CM | POA: Insufficient documentation

## 2015-07-06 DIAGNOSIS — R161 Splenomegaly, not elsewhere classified: Secondary | ICD-10-CM | POA: Diagnosis not present

## 2015-07-06 MED ORDER — OMEPRAZOLE 20 MG PO CPDR: 20.0000 mg | DELAYED_RELEASE_CAPSULE | Freq: Every day | ORAL | Status: DC

## 2015-07-06 MED ORDER — DICLOFENAC SODIUM 75 MG PO TBEC: 75.0000 mg | DELAYED_RELEASE_TABLET | Freq: Two times a day (BID) | ORAL | Status: DC

## 2015-07-06 MED ORDER — GABAPENTIN 600 MG PO TABS: 300.0000 mg | ORAL_TABLET | Freq: Three times a day (TID) | ORAL | Status: AC

## 2015-07-06 MED FILL — GABAPENTIN 600 MG TABLET: 600 | 30 days supply | Qty: 45 | Fill #0

## 2015-07-06 MED FILL — OMEPRAZOLE DR 20 MG CAPSULE: 20 | 30 days supply | Qty: 30 | Fill #0

## 2015-07-06 MED FILL — ?DICLOFENAC SOD DR 75 MG TA: 75 | 30 days supply | Qty: 60 | Fill #0

## 2015-07-06 NOTE — Progress Notes (Signed)
Subjective:    Patient ID: Antonio Armstrong, male    DOB: [birth year], 40 y.o.   MRN: [id number]  HPI He is a 40 year old refugee male with a history of liver cirrhosis with portal hypertension and gastric varices, splenomegaly, thrombocytopenia Here for a follow-up visit accompanied by a Swahili interpreter.  He continues to complain of right knee pain which he had at his last visit and that naproxen is not as effective. He would like to be placed on diclofenac which a friend of his takes. Pain is 5/10 and is worse with going up stairs and squatting. He also endorses abdominal bloating and early satiety. The plan was to refer him to GI for further evaluation of his cirrhosis however he is yet to obtain the [facility] discount.  He denies pedal edema or shortness of breath.  Past Medical History  Diagnosis Date  . Idiopathic cirrhosis (HCC)     HCV RNA not detected 04/29/15; Hep A, Hep B and HIV NR 04/2015  . Thrombocytopenia (HCC)   . Pancytopenia (HCC)   . Splenomegaly   . Portal hypertension (HCC)     History reviewed. No pertinent past surgical history.  No Known Allergies        Review of Systems Review of Systems  Constitutional: Negative for activity change and appetite change.  HENT: Negative for sinus pressure and sore throat.   Eyes: Negative for visual disturbance.  Respiratory: Negative for cough, chest tightness and shortness of breath.   Cardiovascular: Negative for chest pain and leg swelling.  Gastrointestinal: Negative for abdominal pain, diarrhea, constipation and abdominal distention.  Endocrine: Negative.   Genitourinary: Negative for dysuria.  Musculoskeletal:       See HPI  Skin: Negative for rash.  Allergic/Immunologic: Negative.   Neurological: Negative for weakness, light-headedness and numbness.  Psychiatric/Behavioral: Negative for suicidal ideas and dysphoric mood.     Objective: Filed Vitals:   07/06/15 1107  BP: 123/73  Pulse: 62    Temp: 98.5 F (36.9 C)  TempSrc: Oral  Resp: 18  Height:  (1.676 m)  Weight: 155 lb 6.4 oz (70.489 kg)  SpO2: 99%      Physical Exam Constitutional: He is oriented to person, place, and time. He appears well-developed and well-nourished.  Cardiovascular: Normal rate, normal heart sounds and intact distal pulses.   No murmur heard. Pulmonary/Chest: Effort normal and breath sounds normal. He has no wheezes. He has no rales. He exhibits no tenderness.  Abdominal: Soft. Bowel sounds are normal. He exhibits mass (splenomegaly). He exhibits no distension. There is no tenderness.  Musculoskeletal: Normal range of motion. He exhibits tenderness (No tenderness on palpation of right knee but on flexion and extension of right knee). He exhibits no edema.  Neurological: He is alert and oriented to person, place, and time.    CBC Latest Ref Rng 06/15/2015 05/02/2015 05/01/2015  WBC 3.8 - 10.8 K/uL 1.3(L) 1.3(LL) 1.3(LL)  Hemoglobin 13.2 - 17.1 g/dL 12.8(L) 13.1 12.3(L)  Hematocrit 38.5 - 50.0 % 40.3 40.8 37.6(L)  Platelets 140 - 400 K/uL 29(LL) 24(LL) 30(L)     CMP Latest Ref Rng 05/02/2015 05/01/2015 04/30/2015  Glucose 65 - 99 mg/dL 83 91 91  BUN 6 - 20 mg/dL Creatinine 0.61 - 1.24 mg/dL 1.47 8.29 5.62  Sodium 135 - 145 mmol/L 139 136 139  Potassium 3.5 - 5.1 mmol/L 3.7 3.8 3.6  Chloride 101 - 111 mmol/L 102 105 104  CO2 22 - 32  mmol/L 27 24 26   Calcium 8.9 - 10.3 mg/dL 9.1 1.6(X8.5(L) 0.9(U8.5(L)  Total Protein 6.5 - 8.1 g/dL 7.0 6.7 -  Total Bilirubin 0.3 - 1.2 mg/dL 2.0(H) 1.7(H) -  Alkaline Phos 38 - 126 U/L 41 42 -  AST 15 - 41 U/L 16 15 -  ALT 17 - 63 U/L 22 18 -         Assessment & Plan:  1. Idiopathic cirrhosis (HCC) No evidence of pedal edema Continue PPI He will need referral to GI for optimization of management Advised to work on obtaining the [facility] discount to facilitate this process  2. Splenomegaly No current abdominal pain at this time  3. Right knee  pain I have explained to him that he is not a candidate for cortisone injections due to the fact that he has severe thrombocytopenia Switch from naproxen to diclofenac as the patient requests We'll give him a knee brace from the clinic.  4. Thrombocytopenia (HCC) No evidence of bleed Due to splenic sequestration. No further recommendations per hematology.

## 2015-07-06 NOTE — Patient Instructions (Signed)
Stop Naproxen and commence Diclofenac for right knee pain.

## 2015-07-06 NOTE — Progress Notes (Signed)
Patient is here for FU Knee pain  Patient complains of intermittent knee pain being present scaled currently at a 5. Pain increases with bending and walking. Pain decreases with rest.  Patient request refill on Prilosec.  Patient has not taken medication today and patient has not eaten. (Patient is eating currently at [facility]. MA provided juice and snacks for patient)

## 2015-09-14 ENCOUNTER — Encounter: Payer: Self-pay | Admitting: Family Medicine

## 2015-09-14 ENCOUNTER — Ambulatory Visit: Payer: BLUE CROSS/BLUE SHIELD | Attending: Family Medicine | Admitting: Family Medicine

## 2015-09-14 VITALS — BP 105/71 | HR 56 | Temp 97.7°F | Ht 66.0 in | Wt 155.8 lb

## 2015-09-14 DIAGNOSIS — K7460 Unspecified cirrhosis of liver: Secondary | ICD-10-CM

## 2015-09-14 DIAGNOSIS — M25561 Pain in right knee: Secondary | ICD-10-CM

## 2015-09-14 DIAGNOSIS — D696 Thrombocytopenia, unspecified: Secondary | ICD-10-CM | POA: Diagnosis not present

## 2015-09-14 DIAGNOSIS — D61818 Other pancytopenia: Secondary | ICD-10-CM | POA: Diagnosis not present

## 2015-09-14 DIAGNOSIS — Z79899 Other long term (current) drug therapy: Secondary | ICD-10-CM | POA: Diagnosis not present

## 2015-09-14 DIAGNOSIS — R161 Splenomegaly, not elsewhere classified: Secondary | ICD-10-CM | POA: Diagnosis not present

## 2015-09-14 MED ORDER — OMEPRAZOLE 20 MG PO CPDR
20.0000 mg | DELAYED_RELEASE_CAPSULE | Freq: Every day | ORAL | 3 refills | Status: AC
Start: 2015-09-14 — End: ?

## 2015-09-14 MED ORDER — DICLOFENAC SODIUM 75 MG PO TBEC: 75.0000 mg | DELAYED_RELEASE_TABLET | Freq: Two times a day (BID) | ORAL | 2 refills | Status: DC

## 2015-09-14 MED FILL — ?DICLOFENAC SOD DR 75 MG TA: 75 | 30 days supply | Qty: 60 | Fill #0

## 2015-09-14 MED FILL — ?OMEPRAZOLE DR 20 MG CAPSUL: 20 | 30 days supply | Qty: 30 | Fill #0

## 2015-09-14 NOTE — Progress Notes (Signed)
"  pain in the liver"- unable to carry heavy "things" Pain in both knees Medication refills

## 2015-09-14 NOTE — Progress Notes (Signed)
Subjective:    Patient ID: Antonio Armstrong, male    DOB: Sep 17, 1975, 40 y.o.   MRN: [id number]  HPI He is a 40 year old refugee male with a history of liver cirrhosis with portal hypertension and gastric varices, splenomegaly, thrombocytopenia Here for a follow-up visit accompanied by a Swahili interpreter.  He has right knee pain which he rates at a 3/10; he was switched from naproxen to diclofenac at his last visit and reports improvement in symptoms.Pain is worse with going up stairs and squatting. He also endorses abdominal bloating and early satiety, left upper quadrant pain which is intermittent The plan was to refer him to GI for further evaluation of his cirrhosis however he is yet to obtain the [facility] discount.  He denies pedal edema or shortness of breath.  Past Medical History:  Diagnosis Date  . Idiopathic cirrhosis (HCC)    HCV RNA not detected 04/29/15; Hep A, Hep B and HIV NR 04/2015  . Pancytopenia (HCC)   . Portal hypertension (HCC)   . Splenomegaly   . Thrombocytopenia (HCC)     History reviewed. No pertinent surgical history.  No Known Allergies  Current Outpatient Prescriptions on File Prior to Visit  Medication Sig Dispense Refill  . gabapentin (NEURONTIN) 600 MG tablet Take 0.5 tablets (300 mg total) by mouth 3 (three) times daily. (Patient not taking: Reported on 09/14/2015) 90 tablet 3   No current facility-administered medications on file prior to visit.       Review of Systems Constitutional: Negative for activity change and appetite change.  HENT: Negative for sinus pressure and sore throat.   Eyes: Negative for visual disturbance.  Respiratory: Negative for cough, chest tightness and shortness of breath.   Cardiovascular: Negative for chest pain and leg swelling.  Gastrointestinal: Positive for LUQabdominal pain, diarrhea, constipation and abdominal distention.  Endocrine: Negative.   Genitourinary: Negative for dysuria.  Musculoskeletal:       See HPI  Skin: Negative for rash.  Allergic/Immunologic: Negative.   Neurological: Negative for weakness, light-headedness and numbness.  Psychiatric/Behavioral: Negative for suicidal ideas and dysphoric mood.     Objective: Vitals:   09/14/15 1149 09/14/15 1155  BP:  105/71  Pulse:  (!) 56  Temp:  97.7 F (36.5 C)  TempSrc:  Oral  Weight: 155 lb 12.8 oz (70.7 kg)   Height: 5\' 6"  (1.676 m)       Physical Exam Constitutional: He is oriented to person, place, and time. He appears well-developed and well-nourished.  Cardiovascular: Normal rate, normal heart sounds and intact distal pulses.   No murmur heard. Pulmonary/Chest: Effort normal and breath sounds normal. He has no wheezes. He has no rales. He exhibits no tenderness.  Abdominal: Soft. Bowel sounds are normal. He exhibits mass (splenomegaly). He exhibits no distension. There is no tenderness.  Musculoskeletal: Normal range of motion. He exhibits mild tenderness (No tenderness on palpation of right knee but on flexion and extension of right knee). He exhibits no edema.  Neurological: He is alert and oriented to person, place, and time.        Assessment & Plan:  1. Idiopathic cirrhosis (HCC) No evidence of pedal edema Continue PPI He will need referral to GI for optimization of management Advised to work on obtaining the [facility] discount to facilitate this process  2. Splenomegaly No current abdominal pain at this time  3. Right knee pain I have explained to him that he is not a candidate for cortisone injections due  to the fact that he has severe thrombocytopenia Continue diclofenac as this seems to keep his pain at a 3/10  4. Thrombocytopenia (HCC) No evidence of bleed Due to splenic sequestration. No further recommendations per hematology. CBC today.

## 2015-09-15 ENCOUNTER — Other Ambulatory Visit: Payer: Self-pay | Admitting: Family Medicine

## 2015-09-15 DIAGNOSIS — D61818 Other pancytopenia: Secondary | ICD-10-CM

## 2015-09-15 DIAGNOSIS — R161 Splenomegaly, not elsewhere classified: Secondary | ICD-10-CM

## 2015-09-15 LAB — COMPLETE METABOLIC PANEL WITH GFR
ALBUMIN: 4.1 g/dL (ref 3.6–5.1)
ALK PHOS: 47 U/L (ref 40–115)
ALT: 20 U/L (ref 9–46)
AST: 16 U/L (ref 10–40)
BUN: 11 mg/dL (ref 7–25)
CALCIUM: 8.6 mg/dL (ref 8.6–10.3)
CHLORIDE: 104 mmol/L (ref 98–110)
CO2: 24 mmol/L (ref 20–31)
Creat: 0.81 mg/dL (ref 0.60–1.35)
GFR, Est African American: >89 mL/min (ref >=60)
GLUCOSE: 79 mg/dL (ref 65–99)
POTASSIUM: 4 mmol/L (ref 3.5–5.3)
SODIUM: 137 mmol/L (ref 135–146)
Total Bilirubin: 2.1 mg/dL — ABNORMAL HIGH (ref 0.2–1.2)
Total Protein: 7.1 g/dL (ref 6.1–8.1)

## 2015-09-15 LAB — CBC WITH DIFFERENTIAL/PLATELET
BASOS ABS: 11 {cells}/uL (ref 0–200)
BASOS PCT: 1 %
EOS ABS: 22 {cells}/uL (ref 15–500)
Eosinophils Relative: 2 %
HEMATOCRIT: 36.9 % — AB (ref 38.5–50.0)
HEMOGLOBIN: 11.8 g/dL — AB (ref 13.2–17.1)
Lymphocytes Relative: 32 %
Lymphs Abs: 352 cells/uL — ABNORMAL LOW (ref 850–3900)
MCH: 27 pg (ref 27.0–33.0)
MCHC: 32 g/dL (ref 32.0–36.0)
MCV: 84.4 fL (ref 80.0–100.0)
MONO ABS: 77 {cells}/uL — AB (ref 200–950)
MONOS PCT: 7 %
NEUTROS ABS: 638 {cells}/uL — AB (ref 1500–7800)
Neutrophils Relative %: 58 %
PLATELETS: 21 10*3/uL — AB (ref 140–400)
RBC: 4.37 MIL/uL (ref 4.20–5.80)
RDW: 16.2 % — ABNORMAL HIGH (ref 11.0–15.0)
WBC: 1.1 10*3/uL — ABNORMAL LOW (ref 3.8–10.8)

## 2015-09-22 ENCOUNTER — Telehealth: Payer: Self-pay

## 2015-09-22 ENCOUNTER — Ambulatory Visit: Payer: Medicaid - Out of State | Admitting: Hematology

## 2015-09-22 NOTE — Telephone Encounter (Signed)
-----   Message from Enobong Amao, MD sent at 09/15/2015  9:24 AM EDT ----- Please inform him that his kidney function is normal, bilirubin is elevated which could be secondary to his cirrhosis. He does continue to have low white blood cell count, anemia, low platelet count. Have referred him to a hematologist. Please encourage him to work on his [employer] card application to facilitate this referral. If he does experience severe abdominal pain or bleeding he would need to go to the emergency room. 

## 2015-09-22 NOTE — Telephone Encounter (Signed)
Through [employer] [phone] writer called patient and explained Dr. Jen Mow result note and stressed to the patient that if he were to have any bleeding or abdominal pain he should go   Patient had an appt today with Dr. Candise Che - hematologist at the [facility].  Patient was given the wrong location and address so he missed this appt. Writer called the [facility] and spoke with Waynetta Sandy, RN in triage who will expedite a new appt for patient with either Dr. Candise Che or another hematologist. Patient called by Clinical research associate and informed.

## 2015-09-26 ENCOUNTER — Telehealth: Payer: Self-pay

## 2015-09-26 NOTE — Telephone Encounter (Signed)
-----   Message from Jaclyn ShaggyEnobong Amao, MD sent at 09/15/2015  9:24 AM EDT ----- Please inform him that his kidney function is normal, bilirubin is elevated which could be secondary to his cirrhosis. He does continue to have low white blood cell count, anemia, low platelet count. Have referred him to a hematologist. Please encourage him to work on his [employer] card application to facilitate this referral. If he does experience severe abdominal pain or bleeding he would need to go to the emergency room.

## 2015-09-26 NOTE — Telephone Encounter (Signed)
Patient still waiting to reschedule appt with benign hematology.  Missed appt last week.

## 2015-10-03 ENCOUNTER — Ambulatory Visit: Payer: Medicaid - Out of State | Attending: Family Medicine

## 2015-10-12 ENCOUNTER — Ambulatory Visit: Payer: Self-pay | Attending: Internal Medicine

## 2015-10-13 ENCOUNTER — Ambulatory Visit: Payer: Medicaid - Out of State | Admitting: Hematology

## 2015-10-13 ENCOUNTER — Telehealth: Payer: Self-pay | Admitting: Hematology

## 2015-10-13 NOTE — Telephone Encounter (Signed)
PATIENT SENT TO SCHEDULING FROM REGISTRATION TO RESCHEDULE NEW PATIENT APPOINTMENT AS HE ARRIVED 2 HRS LATE. UPON SPEAKING WITH PATIENT VIA PACIFIC INTERPRETER [phone] (ALFONSE) PATIENT STATED HE DID ARRIVE ON TIME, HOWEVER HE WAS CONFUSED AS TO WHAT HE WAS TO DO WHEN HE ARRIVED. PATIENT REPORTED PARKING HIS CAR SOMEWHERE ELSE VERY FAR AWAY, WALKING BACK TO THE CENTER AND NOT KNOWING WHAT TO DO. I APOLOGIZED TO PATIENT FOR THE CONFUSION VIA INTERPRETER, GAVE PATIENT NEW APPOINTMENT DATE/TIME AND AND EXPLANATION FOR WHAT TO DO WHEN HE ARRIVES ON 9/12 @ 1:30 PM FOR HIS 2 PM NEW PATIENT APPOINTMENT WITH DR Candise CheKALE.   PATIENT SPEAKS [place].

## 2015-11-01 ENCOUNTER — Ambulatory Visit (HOSPITAL_BASED_OUTPATIENT_CLINIC_OR_DEPARTMENT_OTHER): Payer: Self-pay

## 2015-11-01 ENCOUNTER — Ambulatory Visit (HOSPITAL_BASED_OUTPATIENT_CLINIC_OR_DEPARTMENT_OTHER): Payer: Self-pay | Admitting: Hematology

## 2015-11-01 VITALS — BP 114/66 | HR 63 | Temp 98.2°F | Resp 18 | Ht 66.0 in | Wt 152.0 lb

## 2015-11-01 DIAGNOSIS — R161 Splenomegaly, not elsewhere classified: Secondary | ICD-10-CM

## 2015-11-01 DIAGNOSIS — D61818 Other pancytopenia: Secondary | ICD-10-CM

## 2015-11-01 DIAGNOSIS — D696 Thrombocytopenia, unspecified: Secondary | ICD-10-CM

## 2015-11-01 LAB — CBC & DIFF AND RETIC
BASO%: 0 % (ref 0.0–2.0)
BASOS ABS: 0 10*3/uL (ref 0.0–0.1)
EOS ABS: 0 10*3/uL (ref 0.0–0.5)
EOS%: 2.6 % (ref 0.0–7.0)
HEMATOCRIT: 37.7 % — AB (ref 38.4–49.9)
HEMOGLOBIN: 12.3 g/dL — AB (ref 13.0–17.1)
Immature Retic Fract: 11.1 % — ABNORMAL HIGH (ref 3.00–10.60)
LYMPH%: 23.1 % (ref 14.0–49.0)
MCH: 27.6 pg (ref 27.2–33.4)
MCHC: 32.6 g/dL (ref 32.0–36.0)
MCV: 84.5 fL (ref 79.3–98.0)
MONO#: 0.1 10*3/uL (ref 0.1–0.9)
MONO%: 11.1 % (ref 0.0–14.0)
NEUT#: 0.7 10*3/uL — ABNORMAL LOW (ref 1.5–6.5)
NEUT%: 63.2 % (ref 39.0–75.0)
PLATELETS: 30 10*3/uL — AB (ref 140–400)
RBC: 4.46 10*6/uL (ref 4.20–5.82)
RDW: 15.6 % — AB (ref 11.0–14.6)
Retic %: 1.67 % (ref 0.80–1.80)
Retic Ct Abs: 74.48 10*3/uL (ref 34.80–93.90)
WBC: 1.2 10*3/uL — ABNORMAL LOW (ref 4.0–10.3)
lymph#: 0.3 10*3/uL — ABNORMAL LOW (ref 0.9–3.3)

## 2015-11-01 LAB — COMPREHENSIVE METABOLIC PANEL
ALBUMIN: 3.8 g/dL (ref 3.5–5.0)
ALK PHOS: 62 U/L (ref 40–150)
ALT: 36 U/L (ref 0–55)
ANION GAP: 7 meq/L (ref 3–11)
AST: 20 U/L (ref 5–34)
BILIRUBIN TOTAL: 1.55 mg/dL — AB (ref 0.20–1.20)
BUN: 12.5 mg/dL (ref 7.0–26.0)
CO2: 27 meq/L (ref 22–29)
CREATININE: 0.8 mg/dL (ref 0.7–1.3)
Calcium: 9.1 mg/dL (ref 8.4–10.4)
Chloride: 106 mEq/L (ref 98–109)
EGFR: >90 mL/min/{1.73_m2} (ref >90)
GLUCOSE: 89 mg/dL (ref 70–140)
Potassium: 3.8 mEq/L (ref 3.5–5.1)
Sodium: 140 mEq/L (ref 136–145)
TOTAL PROTEIN: 7.7 g/dL (ref 6.4–8.3)

## 2015-11-01 LAB — LACTATE DEHYDROGENASE: LDH: 113 U/L — ABNORMAL LOW (ref 125–245)

## 2015-11-01 NOTE — Progress Notes (Signed)
[name]    HEMATOLOGY/ONCOLOGY CONSULTATION NOTE  Date of Service: 11/01/2015  Patient Care Team: Arnoldo Morale, MD as PCP - General (Family Medicine)  CHIEF COMPLAINTS/PURPOSE OF CONSULTATION:  Pancytopenia  HISTORY OF PRESENTING ILLNESS:   Antonio Armstrong is a wonderful 40 y.o. male from the [place] who speaks only Animal nutritionist and has been referred to [place] by Dr .Arnoldo Morale, MD for evaluation and management of pancytopenia.  Obtaining information from the patient is a challenge due to significant communication barrier since the patient does not speak any [place].  We communicated with him using a Swahali interpreter on the computer. He has limited understanding of his overall health and medical conditions. He notes that he has had "stomach problems" ffor a long time and that he has had left upper abdominal pain for the last 2-3 years.  He notes that one of the physicians told him that he had liver problems and about a year ago in [place] and that he should get medical help for this once he reached the [place]. Patient isn't sure if he has any insurance at this time. He is not of any other medical problems that he has.    Patient was admitted to the hospital in March 2017 with abdominal pain and rectal bleeding when the CBC showed a normal hemoglobin of 13.3 with an MCV of 85, and leukopenia with a WBC count of 1.2k with ANC of 0.7 and thrombocytopenia with a platelet count of 21k. Low reticulocyte count. Noted to be iron deficient with a ferritin of 10, and B12, folate within normal limits. Chemistries showed an elevated bilirubin of 2.1 with normal transaminases and alkaline phosphatase. INR was elevated at 1.52 with normal APTT. HIV antibody negative, acute hepatitis profile was negative for hep C antibody, negative for hepatitis B surface antigen, negative for hep B core IgM. Cryoglobulin not detected. 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 No evidence of lymphadenopathy was noted in the abdomen.  He established primary care with Dr. Jarold Song and had had follow up labs on 09/14/2015 showed a hemoglobin of 11.8 with an MCV of 84, WBC count of 1.1k with ANC of 0.6 and platelet count of 21k. He had an ultrasound of the abdomen and Doppler which showed no evidence of portal, hepatic or splenic venous thrombosis.  Patient currently notes no evidence of bleeding.  He has had no issues with any infections at this time.  No fevers no chills no night sweats no unexpected weight loss.    MEDICAL HISTORY:  Past Medical History:  Diagnosis Date  . Idiopathic cirrhosis (HCC)    HCV RNA not detected 04/29/15; Hep A, Hep B and HIV NR 04/2015  . Pancytopenia ([place])   . Portal hypertension ([place])   . Splenomegaly   . Thrombocytopenia ([place])     SURGICAL HISTORY: No past surgical history on file.  SOCIAL HISTORY: Social History   Social History  . Marital status: Single    Spouse name: N/A  . Number of children: N/A  . Years of education: N/A   Occupational History  . Not on file.   Social History Main Topics  . Smoking status: Never Smoker  . Smokeless tobacco: Never Used  . Alcohol use No  . Drug use: No  . Sexual activity: Not on file  Other Topics Concern  . Not on file   Social History Narrative  . No narrative on file  denies significant alcohol use.  FAMILY HISTORY: Family History  Problem Relation Age of Onset  . Headache Mother   . Bleeding Disorder Brother     Nosebleeding  patient reports no history of liver disorder.  ALLERGIES:  has No Known Allergies.  MEDICATIONS:  Current Outpatient Prescriptions  Medication Sig Dispense Refill  . diclofenac (VOLTAREN) 75 MG EC tablet Take 1 tablet (75 mg total) by mouth 2 (two) times daily. 60 tablet 2  .  gabapentin (NEURONTIN) 600 MG tablet Take 0.5 tablets (300 mg total) by mouth 3 (three) times daily. (Patient not taking: Reported on 09/14/2015) 90 tablet 3  . omeprazole (PRILOSEC) 20 MG capsule Take 1 capsule (20 mg total) by mouth daily. 30 capsule 3   No current facility-administered medications for this visit.     REVIEW OF SYSTEMS:    10 Point review of Systems was done is negative except as noted above.  PHYSICAL EXAMINATION: ECOG PERFORMANCE STATUS: 1 - Symptomatic but completely ambulatory  . Vitals:   11/01/15 1420  BP: 114/66  Pulse: 63  Resp: 18  Temp: 98.2 F (36.8 C)   Filed Weights   11/01/15 1420  Weight: 152 lb (68.9 kg)   .Body mass index is 24.53 kg/m.  GENERAL:alert, in no acute distress and comfortable SKIN: skin color, texture, turgor are normal, no rashes or significant lesions EYES: normal, conjunctiva are pink and non-injected, sclera clear OROPHARYNX:no exudate, no erythema and lips, buccal mucosa, and tongue normal  NECK: supple, no JVD, thyroid normal size, non-tender, without nodularity LYMPH:  no palpable lymphadenopathy in the cervical, axillary or inguinal LUNGS: clear to auscultation with normal respiratory effort HEART: regular rate & rhythm,  no murmurs and no lower extremity edema ABDOMEN: abdomen soft, non-tender, normoactive bowel sounds  Musculoskeletal: no cyanosis of digits and no clubbing  PSYCH: alert & oriented x 3 with fluent speech NEURO: no focal motor/sensory deficits  LABORATORY DATA:  I have reviewed the data as listed  . CBC Latest Ref Rng & Units 11/01/2015 09/14/2015 06/15/2015  WBC 4.0 - 10.3 10e3/uL 1.2(L) 1.1(L) 1.3(L)  Hemoglobin 13.0 - 17.1 g/dL 12.3(L) 11.8(L) 12.8(L)  Hematocrit 38.4 - 49.9 % 37.7(L) 36.9(L) 40.3  Platelets 140 - 400 10e3/uL 30(L) 21(LL) 29(LL)    . CMP Latest Ref Rng & Units 11/01/2015 09/14/2015 05/02/2015  Glucose 70 - 140 mg/dl 89 79 83  BUN 7.0 - 26.0 mg/dL 12.5 11 10   Creatinine 0.7 -  1.3 mg/dL 0.8 0.81 0.78  Sodium 136 - 145 mEq/L 140 137 139  Potassium 3.5 - 5.1 mEq/L 3.8 4.0 3.7  Chloride 98 - 110 mmol/L - 104 102  CO2 22 - 29 mEq/L 27 24 27   Calcium 8.4 - 10.4 mg/dL 9.1 8.6 9.1  Total Protein 6.4 - 8.3 g/dL 7.7 7.1 7.0  Total Bilirubin 0.20 - 1.20 mg/dL 1.55(H) 2.1(H) 2.0(H)  Alkaline Phos 40 - 150 U/L 62 47 41  AST 5 - 34 U/L 20 16 16   ALT 0 - 55 U/L 36 20 22   Component     Latest Ref Rng & Units 04/26/2015 04/27/2015 04/29/2015  Hepatitis B Surface Ag     Negative  Negative   HCV Ab     0.0 - 0.9 s/co ratio  0.1   Hep A Ab, IgM     Negative  Negative   Hep B  Core Ab, IgM     Negative  Negative   Iron     45 - 182 ug/dL  63   TIBC     250 - 450 ug/dL  288   Saturation Ratios     17.9 - 39.5 %  22   UIBC     ug/dL  225   Prothrombin Time     11.6 - 15.2 seconds 18.3 (H)    INR     0.00 - 1.49 1.52 (H)    HCV Quantitative     >50 IU/mL   HCV Not Detected  Test Information        Comment  HIV     Non Reactive  Non Reactive   Vitamin B12     180 - 914 pg/mL  572   Folate     >5.9 ng/mL  10.8   Ferritin     22 - 316 ng/ml  10 (L)   Cryoglobulin     None detected  Comment   LDH     125 - 245 U/L     Hep B Core Ab, Tot     Negative     Hep C Virus Ab     0.0 - 0.9 s/co ratio      Component     Latest Ref Rng & Units 11/01/2015  Hepatitis B Surface Ag     Negative Negative  HCV Ab     0.0 - 0.9 s/co ratio   Hep A Ab, IgM     Negative   Hep B Core Ab, IgM     Negative   Iron     45 - 182 ug/dL   TIBC     250 - 450 ug/dL   Saturation Ratios     17.9 - 39.5 %   UIBC     ug/dL   Prothrombin Time     11.6 - 15.2 seconds   INR     0.00 - 1.49   HCV Quantitative     >50 IU/mL   Test Information        HIV     Non Reactive Non Reactive  Vitamin B12     180 - 914 pg/mL   Folate     >5.9 ng/mL   Ferritin     22 - 316 ng/ml 22  Cryoglobulin     None detected   LDH     125 - 245 U/L 113 (L)  Hep B Core Ab, Tot     Negative  Positive (A)  Hep C Virus Ab     0.0 - 0.9 s/co ratio <0.1    RADIOGRAPHIC STUDIES: I have personally reviewed the radiological images as listed and agreed with the findings in the report.  CT Abd/pelvis 04/20/2015 IMPRESSION: Cirrhosis with splenomegaly and portal hypertension. 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 Mild amount of ascites in the right lower quadrant.  Gallbladder wall thickening may be due to portal hypertension versus cholecystitis. Correlate with pain in this area.   Electronically Signed   By: Franchot Gallo M.D.   On: 04/26/2015 22:52    ASSESSMENT & PLAN:   40 year old African gentleman from [place] with  1) Leucopenia/Neutropenia - this appears to be chronic and appears unchanged over the last 6 months.  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 No issues with frequent infections. No indication for G-CSF at this time.  Also the use of G-CSF would be dangerous in  the setting of massive splenomegaly since this could cause splenic rupture.  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 Unlikely ITP.  3) mild normocytic normochromic anemia - predominantly related to liver cirrhosis and hypersplenism due to massive splenomegaly.  Some element of iron deficiency possibly due to slow GI losses. LDH is within normal limits and suggests no evidence of hemolysis.  4) liver cirrhosis with massive splenomegaly.  Unclear etiology of liver cirrhosis.  Appears to have a history of hepatitis B infection with hepatitis B total core antibody positive and Hep B c IgM neg. Hep B S Ag neg, HCV AB and RNA PCR neg. Patient has evidence of portal hypertension, mild ascites and coagulopathy. Plan  -Patient's pancytopenia appears to be predominantly driven by the patient's liver disease and associated massive splenomegaly due to congestive splenomegaly in the setting of portal hypertension. -It appears less likely that the  patient has a primary hematologic condition such as a myeloproliferative neoplasm or MDS or aplastic anemia or lymphoproliferative process such as hairy cell leukemia were splenic lymphoma. -A mild iron deficiency can be replaced with iron polysaccharide 150 mg by mouth daily. -He needs a detailed workup by gastroenterology to determine the etiology of his liver cirrhosis.  If the liver biopsy is being considered patient will likely need platelet transfusion to a platelet count of 50k to minimize the risk of bleeding in the setting of associated coagulopathy. -He will also need optimization of treatment of his portal hypertension, endoscopy for variceal screening and MELD labs followup and consideration of liver transplantation if and when needed. -Consider vitamin K replacement 5-10 milligrams by mouth daily and repeat INR after one week to check for reversibility. -SW referral for insurance related issues.  Patient was given a card for the Education officer, museum.  This is important since in the future  If the patient becomes more thrombocytopenic and Nplate is considered patient will not be able to get this Without insurance coverage. -all these findings were explained to the patient with the help of a translator.  We discussed that a bone marrow biopsy would be the next step if additional confirmation was needed regarding his blood picture but I thought that this would be rather low yield.  He wants to wait on the bone marrow biopsy until his liver picture is cleared. -Evaluation of his liver pathology and associated splenomegaly is his primary health care priority at this time. -He was counseled to avoid contact sports or any other activities that can cause abdominal trauma due to high risk for splenic rupture. -he was also noted to watch out for infections and take adequate neutropenic precautions as well as to monitor for evidence of bleeding.  He needs close followup with his primary care physician and an  urgent GI/hepatology evaluation.  We shall see him back in 3 months with repeat labs after his liver evaluation and related management has been done.  All of the patients questions were answered with apparent satisfaction. The patient knows to call the clinic with any problems, questions or concerns.  I spent 60 minutes counseling the patient face to face. The total time spent in the appointment was 60 minutes and more than 50% was on counseling and direct patient cares.    Sullivan Lone MD [place] AAHIVMS [facility] Hematology/Oncology Physician [facility]  (Office):       [phone] (Work cell):  [phone] (Fax):           [phone]  11/01/2015  2:30 PM

## 2015-11-02 LAB — FERRITIN: FERRITIN: 22 ng/mL (ref 22–316)

## 2015-11-02 LAB — HIV ANTIBODY (ROUTINE TESTING W REFLEX): HIV SCREEN 4TH GENERATION: NONREACTIVE

## 2015-11-02 LAB — HEPATITIS B SURFACE ANTIGEN: HBsAg Screen: NEGATIVE

## 2015-11-02 LAB — HEPATITIS C ANTIBODY: Hep C Virus Ab: <0.1 s/co ratio (ref 0.0–0.9)

## 2015-11-02 LAB — HEPATITIS B CORE ANTIBODY, TOTAL: Hep B Core Ab, Tot: POSITIVE — AB

## 2015-12-14 ENCOUNTER — Telehealth: Payer: Self-pay | Admitting: *Deleted

## 2015-12-14 NOTE — Telephone Encounter (Signed)
Client at center for help with pass due bills to [facility], and [facility] Calls were made and some assistance was provided. Client has a job and showed him how to use his insurance card and that  He should continue to work since he made statement to interpreter that he was going to stop working. Chantel talked with financial  Department explaining that with some of the late bills client was not working . Help was asked for these and the department will be looking at this also for the client. [employer] Chamaine Stankus RN BSN CN [phone]

## 2016-12-21 IMAGING — DX DG KNEE 1-2V*R*
2 series · 2 of 2 positions shown · non-contrast
Comparison: None.

CLINICAL DATA: Bilateral knee pain, no known injury.

EXAM:
RIGHT KNEE - 1-2 VIEW

[t knee ap right]
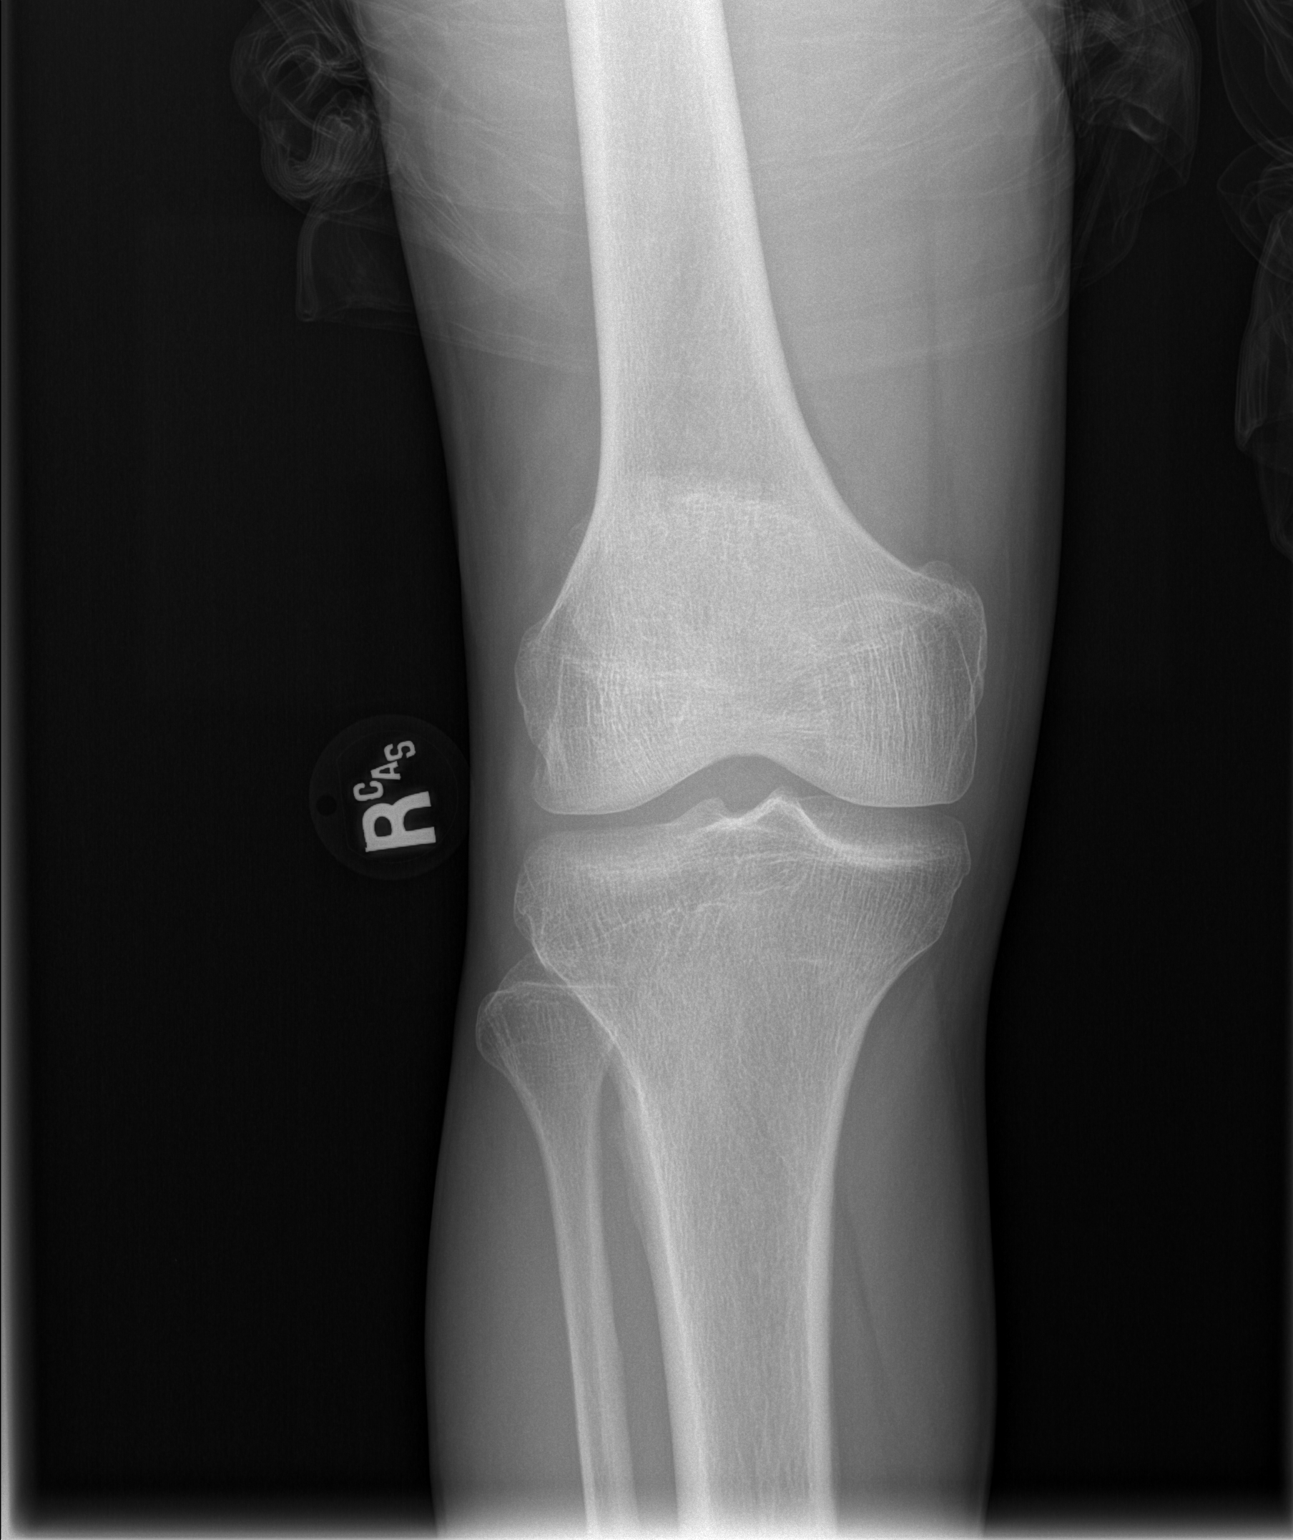

[t knee obl right]
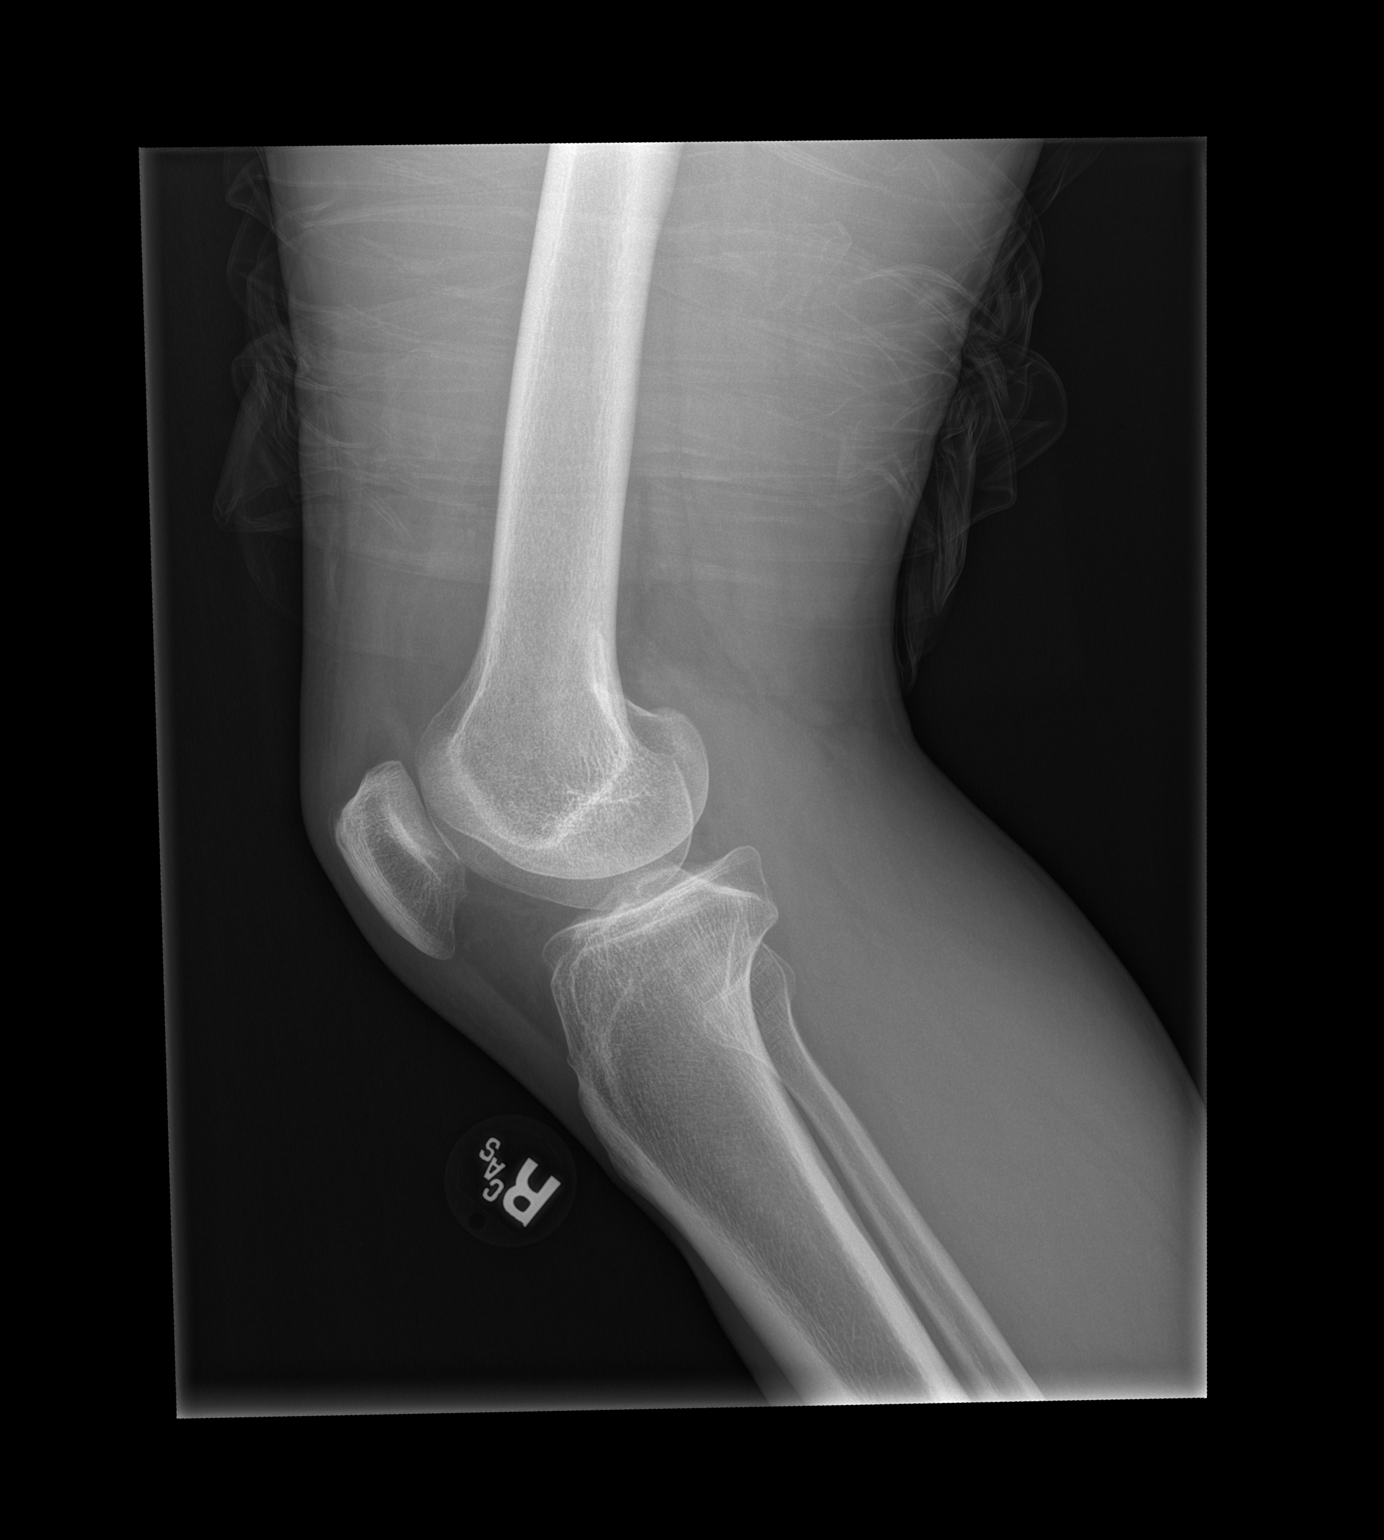

[2 of 2 positions shown; findings below may reference images not displayed]

FINDINGS: There is no evidence of fracture or dislocation. There is no
evidence of arthropathy or other focal bone abnormality. Trace
effusion. Soft tissues are unremarkable.
IMPRESSION: Trace effusion.  No bony abnormality.

## 2016-12-21 IMAGING — DX DG KNEE 1-2V*L*
2 series · 2 of 2 positions shown · non-contrast
Comparison: None.

CLINICAL DATA: Bilateral knee pain, no known injury

EXAM:
LEFT KNEE - 1-2 VIEW

[t knee ap left]
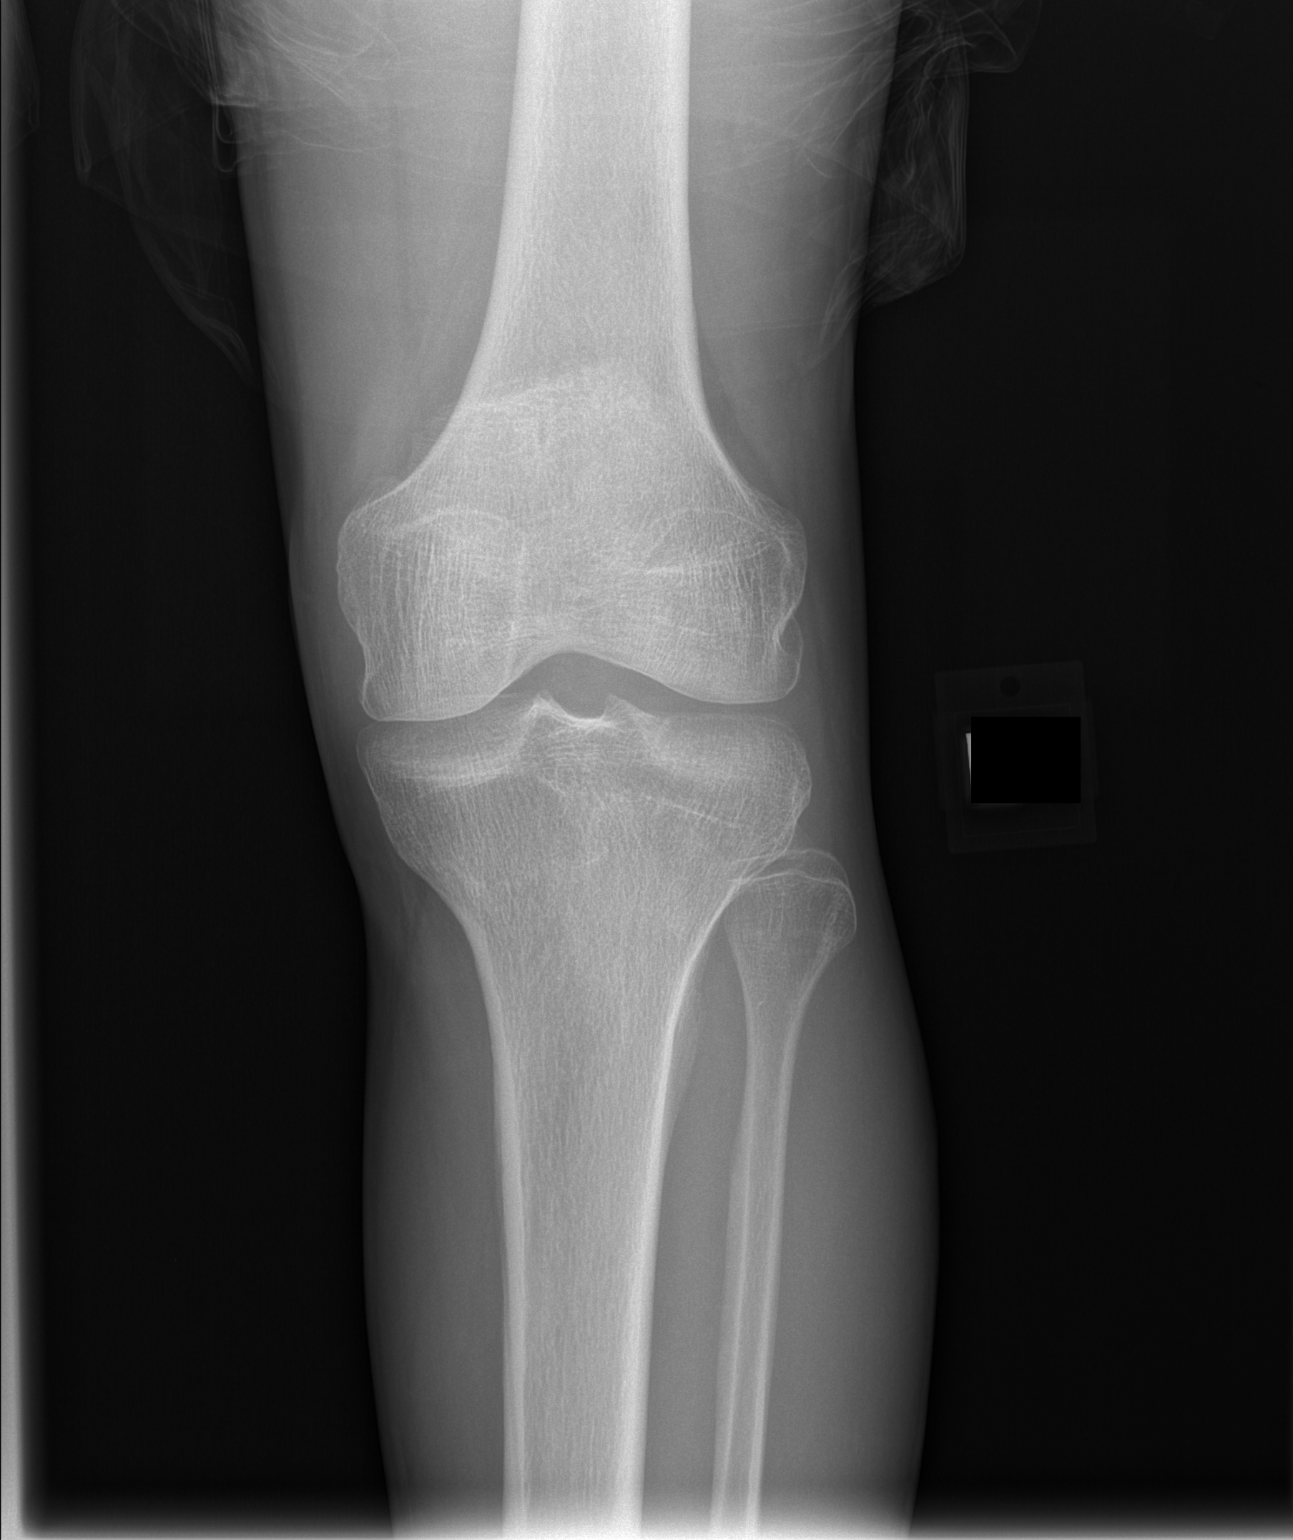

[t knee lat left]
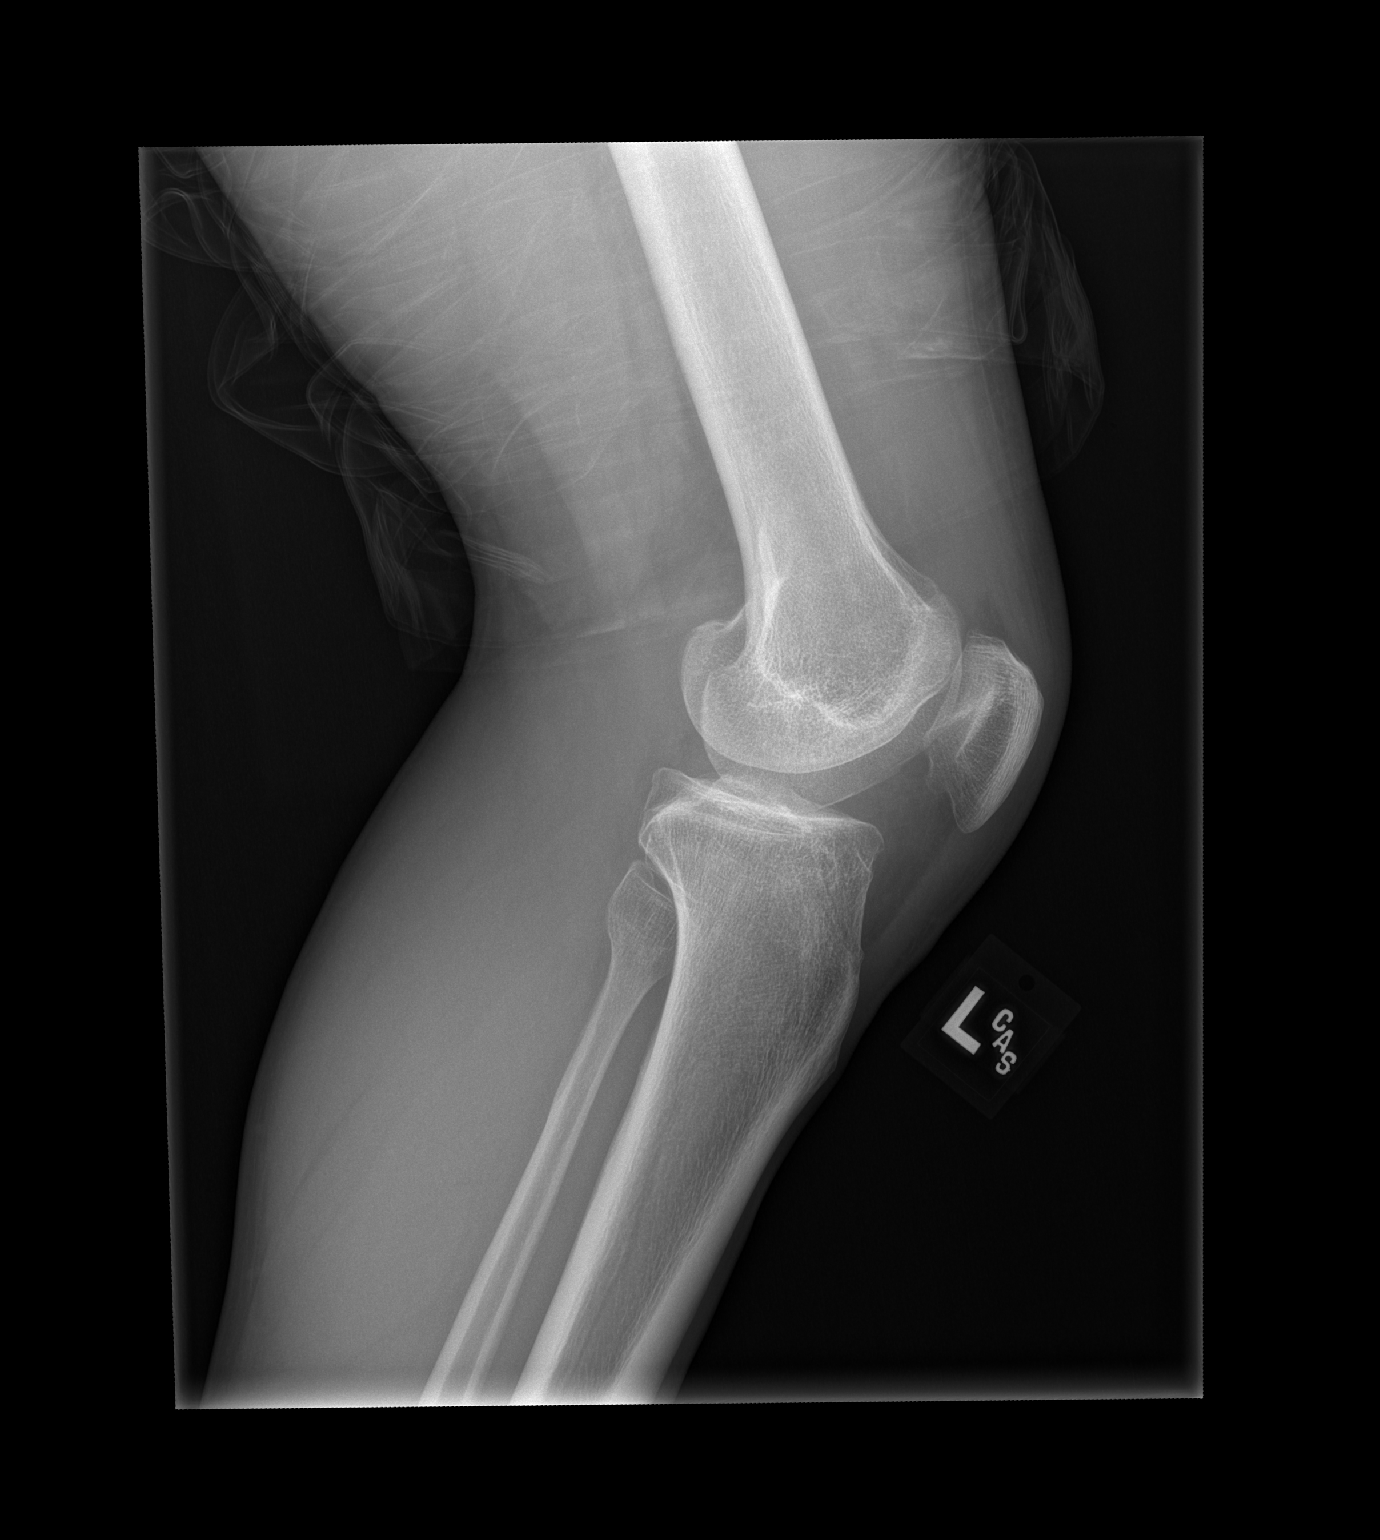

[2 of 2 positions shown; findings below may reference images not displayed]

FINDINGS: No fracture or dislocation is seen.

The joint spaces are preserved.

Visualized soft tissues are within normal limits.

Suspected small suprapatellar knee joint effusion.
IMPRESSION: No fracture or dislocation is seen.

Suspected small suprapatellar knee joint effusion.
